# Patient Record
Sex: Male | Born: 1960 | Race: Black or African American | Hispanic: No | Marital: Married | Smoking: Never smoker
Health system: Southern US, Community
[De-identification: ages and names within clinical notes are randomized; demographics above are authoritative.]

## PROBLEM LIST (undated history)

## (undated) DIAGNOSIS — G4733 Obstructive sleep apnea (adult) (pediatric): Secondary | ICD-10-CM

## (undated) DIAGNOSIS — I10 Essential (primary) hypertension: Secondary | ICD-10-CM

## (undated) DIAGNOSIS — N200 Calculus of kidney: Secondary | ICD-10-CM

## (undated) HISTORY — PX: HERNIA REPAIR: SHX51

## (undated) HISTORY — PX: LITHOTRIPSY: SUR834

## (undated) HISTORY — PX: BACK SURGERY: SHX140

## (undated) HISTORY — PX: KNEE SURGERY: SHX244

## (undated) HISTORY — PX: SHOULDER SURGERY: SHX246

---

## 2015-11-01 ENCOUNTER — Encounter (HOSPITAL_BASED_OUTPATIENT_CLINIC_OR_DEPARTMENT_OTHER): Payer: Self-pay | Admitting: *Deleted

## 2015-11-01 ENCOUNTER — Emergency Department (HOSPITAL_BASED_OUTPATIENT_CLINIC_OR_DEPARTMENT_OTHER)
Admission: EM | Admit: 2015-11-01 | Discharge: 2015-11-01 | Disposition: A | Discharge status: Home / Self Care | Payer: BLUE CROSS/BLUE SHIELD | Attending: Emergency Medicine | Admitting: Emergency Medicine

## 2015-11-01 DIAGNOSIS — M542 Cervicalgia: Secondary | ICD-10-CM | POA: Diagnosis not present

## 2015-11-01 DIAGNOSIS — M79602 Pain in left arm: Secondary | ICD-10-CM | POA: Diagnosis not present

## 2015-11-01 DIAGNOSIS — M549 Dorsalgia, unspecified: Secondary | ICD-10-CM | POA: Insufficient documentation

## 2015-11-01 DIAGNOSIS — Z79899 Other long term (current) drug therapy: Secondary | ICD-10-CM | POA: Insufficient documentation

## 2015-11-01 HISTORY — DX: Calculus of kidney: N20.0

## 2015-11-01 MED ORDER — KETOROLAC TROMETHAMINE 30 MG/ML IJ SOLN
30.0000 mg | Freq: Once | INTRAMUSCULAR | Status: AC
  Administered 2015-11-01: 30 mg via INTRAMUSCULAR
  Filled 2015-11-01: qty 1

## 2015-11-01 NOTE — Discharge Instructions (Signed)
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
## 2015-11-01 NOTE — ED Notes (Signed)
Pt given d/c instructions as per chart. Verbalizes understanding. No questions. 

## 2016-02-22 ENCOUNTER — Encounter (HOSPITAL_BASED_OUTPATIENT_CLINIC_OR_DEPARTMENT_OTHER): Payer: Self-pay | Admitting: *Deleted

## 2016-02-22 ENCOUNTER — Emergency Department (HOSPITAL_BASED_OUTPATIENT_CLINIC_OR_DEPARTMENT_OTHER)
Admission: EM | Admit: 2016-02-22 | Discharge: 2016-02-22 | Disposition: A | Discharge status: Home / Self Care | Payer: BLUE CROSS/BLUE SHIELD | Attending: Emergency Medicine | Admitting: Emergency Medicine

## 2016-02-22 DIAGNOSIS — R1032 Left lower quadrant pain: Secondary | ICD-10-CM | POA: Diagnosis present

## 2016-02-22 DIAGNOSIS — N23 Unspecified renal colic: Secondary | ICD-10-CM

## 2016-02-22 DIAGNOSIS — N201 Calculus of ureter: Secondary | ICD-10-CM | POA: Diagnosis not present

## 2016-02-22 HISTORY — DX: Obstructive sleep apnea (adult) (pediatric): G47.33

## 2016-02-22 LAB — URINALYSIS, ROUTINE W REFLEX MICROSCOPIC
Bilirubin Urine: NEGATIVE
Glucose, UA: NEGATIVE mg/dL
Ketones, ur: NEGATIVE mg/dL
Leukocytes, UA: NEGATIVE
Nitrite: NEGATIVE
Protein, ur: NEGATIVE mg/dL
Specific Gravity, Urine: 1.01 (ref 1.005–1.030)
pH: 6.5 (ref 5.0–8.0)

## 2016-02-22 LAB — URINE MICROSCOPIC-ADD ON: Bacteria, UA: NONE SEEN

## 2016-02-22 MED ORDER — IBUPROFEN 800 MG PO TABS
800.0000 mg | ORAL_TABLET | Freq: Once | ORAL | Status: AC
  Administered 2016-02-22: 800 mg via ORAL
  Filled 2016-02-22: qty 1

## 2016-02-22 NOTE — ED Provider Notes (Signed)
[place] DEPT [facility] Provider Note   CSN: [id number] Arrival date & time: 02/22/16  0534     History   Chief Complaint Chief Complaint  Patient presents with  . Abdominal Pain    LLQ, L inguinal    HPI Ronald Hurley is a 55 y.o. male.Complains of left lower quadrant pain nonradiating typical of kidney stone he said in the past began approximately 24 hours ago. Pain waxes and wanes. He's been asymptomatic since he passed a kidney stone here in the emergency department this morning. He treated himself with one Percocet tablet. No nausea or vomiting. No fever. Last bowel movement yesterday, normal. No other associated symptoms.  HPI  Past Medical History:  Diagnosis Date  . Kidney stone   . OSA (obstructive sleep apnea)     There are no active problems to display for this patient.   Past Surgical History:  Procedure Laterality Date  . BACK SURGERY    . HERNIA REPAIR    . KNEE SURGERY    . KNEE SURGERY    . SHOULDER SURGERY    . SHOULDER SURGERY     Umbilical hernia repair    Home Medications    Prior to Admission medications   Medication Sig Start Date End Date Taking? Authorizing Provider  PARoxetine (PAXIL) 10 MG tablet Take 10 mg by mouth daily.    Historical Provider, MD    Family History History reviewed. No pertinent family history.  Social History Social History  Substance Use Topics  . Smoking status: Never Smoker  . Smokeless tobacco: Never Used  . Alcohol use No   Occasional alcohol use no illicit drug use  Allergies   Review of patient's allergies indicates no known allergies.   Review of Systems Review of Systems  Constitutional: Negative.   HENT: Negative.   Respiratory: Negative.   Cardiovascular: Negative.   Gastrointestinal: Positive for abdominal pain.  Musculoskeletal: Negative.   Skin: Negative.   Neurological: Negative.   Psychiatric/Behavioral: Negative.   All other systems reviewed and are negative.    Physical  Exam Updated Vital Signs BP 132/93 (BP Location: Left Arm)   Pulse 60   Temp 97.7 F (36.5 C) (Oral)   Resp 20   Ht 6\' 1"  (1.854 m)   Wt 240 lb (108.9 kg)   SpO2 99%   BMI 31.66 kg/m   Physical Exam  Constitutional: He appears well-developed and well-nourished.  HENT:  Head: Normocephalic and atraumatic.  Eyes: Conjunctivae are normal. Pupils are equal, round, and reactive to light.  Neck: Neck supple. No tracheal deviation present. No thyromegaly present.  Cardiovascular: Normal rate and regular rhythm.   No murmur heard. Pulmonary/Chest: Effort normal and breath sounds normal.  Abdominal: Soft. Bowel sounds are normal. He exhibits no distension. There is no tenderness.  Genitourinary: Penis normal.  Genitourinary Comments: Scrotum normal  Musculoskeletal: Normal range of motion. He exhibits no edema or tenderness.  Neurological: He is alert. Coordination normal.  Skin: Skin is warm and dry. No rash noted.  Psychiatric: He has a normal mood and affect.  Nursing note and vitals reviewed.    ED Treatments / Results  Labs (all labs ordered are listed, but only abnormal results are displayed) Labs Reviewed  URINALYSIS, ROUTINE W REFLEX MICROSCOPIC (NOT AT [facility]) - Abnormal; Notable for the following:       Result Value   Hgb urine dipstick LARGE (*)    All other components within normal limits  URINE MICROSCOPIC-ADD ON -  Abnormal; Notable for the following:    Squamous Epithelial / LPF 0-5 (*)    All other components within normal limits    EKG  EKG Interpretation None       Radiology No results found.  Procedures Procedures (including critical care time) Results for orders placed or performed during the hospital encounter of 02/22/16  Urinalysis, Routine w reflex microscopic (not at [facility]  Result Value Ref Range   Color, Urine YELLOW YELLOW   APPearance CLEAR CLEAR   Specific Gravity, Urine 1.010 1.005 - 1.030   pH 6.5 5.0 - 8.0   Glucose, UA NEGATIVE  NEGATIVE mg/dL   Hgb urine dipstick LARGE (A) NEGATIVE   Bilirubin Urine NEGATIVE NEGATIVE   Ketones, ur NEGATIVE NEGATIVE mg/dL   Protein, ur NEGATIVE NEGATIVE mg/dL   Nitrite NEGATIVE NEGATIVE   Leukocytes, UA NEGATIVE NEGATIVE  Urine microscopic-add on  Result Value Ref Range   Squamous Epithelial / LPF 0-5 (A) NONE SEEN   WBC, UA 0-5 0 - 5 WBC/hpf   RBC / HPF 6-30 0 - 5 RBC/hpf   Bacteria, UA NONE SEEN NONE SEEN   Urine-Other MUCOUS PRESENT    No results found. Medications Ordered in ED Medications  ibuprofen (ADVIL,MOTRIN) tablet 800 mg (800 mg Oral Given 02/22/16 0651)   Results for orders placed or performed during the hospital encounter of 02/22/16  Urinalysis, Routine w reflex microscopic (not at [facility])  Result Value Ref Range   Color, Urine YELLOW YELLOW   APPearance CLEAR CLEAR   Specific Gravity, Urine 1.010 1.005 - 1.030   pH 6.5 5.0 - 8.0   Glucose, UA NEGATIVE NEGATIVE mg/dL   Hgb urine dipstick LARGE (A) NEGATIVE   Bilirubin Urine NEGATIVE NEGATIVE   Ketones, ur NEGATIVE NEGATIVE mg/dL   Protein, ur NEGATIVE NEGATIVE mg/dL   Nitrite NEGATIVE NEGATIVE   Leukocytes, UA NEGATIVE NEGATIVE  Urine microscopic-add on  Result Value Ref Range   Squamous Epithelial / LPF 0-5 (A) NONE SEEN   WBC, UA 0-5 0 - 5 WBC/hpf   RBC / HPF 6-30 0 - 5 RBC/hpf   Bacteria, UA NONE SEEN NONE SEEN   Urine-Other MUCOUS PRESENT    No results found.  Initial Impression / Assessment and Plan / ED Course  I have reviewed the triage vital signs and the nursing notes.  Pertinent labs & imaging results that were available during my care of the patient were reviewed by me and considered in my medical decision making (see chart for details).  Clinical Course    Patient asymptomatic here. He's advised to take Advil 4 tablets every 8 hours as needed for pain. He has 1 remaining Percocet tablet. He is advised follow-up with his urologist whom he seen in the past in [place]. He can  take   the kidney stone that he passed today in the emergency department to the urologist office Final Clinical Impressions(s) / ED Diagnoses  Diagnosis ureteral colic Final diagnoses:  Ureteral colic    New Prescriptions New Prescriptions   No medications on file     Orlie Dakin, MD 02/22/16 [phone]

## 2016-02-22 NOTE — ED Notes (Signed)
Dr. Winfred Leeds at [facility]

## 2016-02-22 NOTE — ED Triage Notes (Signed)
C/o LLQ/ L inguinal area pain, onset 0045, "felt like previous kidney stone 3 yrs ago", mentions some urinary hesitancy, (denies: nvd, fever, bleeding or other sx), took a percocet 5/325 at 0100, rates pain 5/10 at this time. Also states, "I think I passed it with urine sample", stone particle saved in specimen cup at [facility]).

## 2016-02-22 NOTE — Discharge Instructions (Signed)
You can take 4 Advil tablets every 8 hours as needed for pain. Call your urologist today to schedule the next available appointment. Take the kidney stone that he passed with you to the urologist's office

## 2016-05-12 ENCOUNTER — Emergency Department (HOSPITAL_BASED_OUTPATIENT_CLINIC_OR_DEPARTMENT_OTHER): Payer: BLUE CROSS/BLUE SHIELD

## 2016-05-12 ENCOUNTER — Emergency Department (HOSPITAL_BASED_OUTPATIENT_CLINIC_OR_DEPARTMENT_OTHER)
Admission: EM | Admit: 2016-05-12 | Discharge: 2016-05-12 | Disposition: A | Discharge status: Home / Self Care | Payer: BLUE CROSS/BLUE SHIELD | Attending: Emergency Medicine | Admitting: Emergency Medicine

## 2016-05-12 ENCOUNTER — Encounter (HOSPITAL_BASED_OUTPATIENT_CLINIC_OR_DEPARTMENT_OTHER): Payer: Self-pay

## 2016-05-12 DIAGNOSIS — J209 Acute bronchitis, unspecified: Secondary | ICD-10-CM | POA: Diagnosis not present

## 2016-05-12 DIAGNOSIS — Z79899 Other long term (current) drug therapy: Secondary | ICD-10-CM | POA: Insufficient documentation

## 2016-05-12 DIAGNOSIS — R079 Chest pain, unspecified: Secondary | ICD-10-CM

## 2016-05-12 DIAGNOSIS — R072 Precordial pain: Secondary | ICD-10-CM | POA: Diagnosis present

## 2016-05-12 LAB — CBC WITH DIFFERENTIAL/PLATELET
Basophils Absolute: 0 10*3/uL (ref 0.0–0.1)
Basophils Relative: 1 %
Eosinophils Absolute: 0.5 10*3/uL (ref 0.0–0.7)
Eosinophils Relative: 8 %
HCT: 45.1 % (ref 39.0–52.0)
Hemoglobin: 15.4 g/dL (ref 13.0–17.0)
Lymphocytes Relative: 23 %
Lymphs Abs: 1.4 10*3/uL (ref 0.7–4.0)
MCH: 29.8 pg (ref 26.0–34.0)
MCHC: 34.1 g/dL (ref 30.0–36.0)
MCV: 87.4 fL (ref 78.0–100.0)
Monocytes Absolute: 0.7 10*3/uL (ref 0.1–1.0)
Monocytes Relative: 11 %
Neutro Abs: 3.6 10*3/uL (ref 1.7–7.7)
Neutrophils Relative %: 57 %
Platelets: 167 10*3/uL (ref 150–400)
RBC: 5.16 MIL/uL (ref 4.22–5.81)
RDW: 13 % (ref 11.5–15.5)
WBC: 6.3 10*3/uL (ref 4.0–10.5)

## 2016-05-12 LAB — BASIC METABOLIC PANEL
Anion gap: 5 (ref 5–15)
BUN: 17 mg/dL (ref 6–20)
CO2: 24 mmol/L (ref 22–32)
Calcium: 8.9 mg/dL (ref 8.9–10.3)
Chloride: 110 mmol/L (ref 101–111)
Creatinine, Ser: 1.27 mg/dL — ABNORMAL HIGH (ref 0.61–1.24)
GFR calc Af Amer: >60 mL/min (ref >60)
GFR calc non Af Amer: >60 mL/min (ref >60)
Glucose, Bld: 87 mg/dL (ref 65–99)
Potassium: 4.4 mmol/L (ref 3.5–5.1)
Sodium: 139 mmol/L (ref 135–145)

## 2016-05-12 LAB — TROPONIN I: Troponin I: <0.03 ng/mL (ref <0.03)

## 2016-05-12 MED ORDER — AZITHROMYCIN 250 MG PO TABS: ORAL_TABLET | ORAL | 0 refills | Status: DC

## 2016-05-12 NOTE — Discharge Instructions (Signed)
Zithromax as prescribed.  Ibuprofen 600 mg every 6 hours as needed for pain.  Return to the emergency department if your symptoms significantly worsen or change.
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
## 2016-05-12 NOTE — ED Notes (Signed)
Pt returned from xray

## 2016-05-12 NOTE — ED Triage Notes (Signed)
Mid sternal chest pain that started last night made worse with movement and coughing.  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  Pt is not in any acute distress, speaking in full sentences and able to ambulate without difficulty or SOB.

## 2016-05-12 NOTE — ED Notes (Signed)
ED Provider at bedside. 

## 2016-08-01 ENCOUNTER — Encounter (HOSPITAL_BASED_OUTPATIENT_CLINIC_OR_DEPARTMENT_OTHER): Payer: Self-pay | Admitting: Emergency Medicine

## 2016-08-01 ENCOUNTER — Emergency Department (HOSPITAL_BASED_OUTPATIENT_CLINIC_OR_DEPARTMENT_OTHER)
Admission: EM | Admit: 2016-08-01 | Discharge: 2016-08-01 | Disposition: A | Discharge status: Home / Self Care | Payer: BLUE CROSS/BLUE SHIELD | Attending: Emergency Medicine | Admitting: Emergency Medicine

## 2016-08-01 DIAGNOSIS — R197 Diarrhea, unspecified: Secondary | ICD-10-CM | POA: Diagnosis not present

## 2016-08-01 DIAGNOSIS — Z79899 Other long term (current) drug therapy: Secondary | ICD-10-CM | POA: Diagnosis not present

## 2016-08-01 DIAGNOSIS — R51 Headache: Secondary | ICD-10-CM | POA: Diagnosis not present

## 2016-08-01 DIAGNOSIS — J029 Acute pharyngitis, unspecified: Secondary | ICD-10-CM | POA: Insufficient documentation

## 2016-08-01 DIAGNOSIS — R079 Chest pain, unspecified: Secondary | ICD-10-CM | POA: Insufficient documentation

## 2016-08-01 DIAGNOSIS — R05 Cough: Secondary | ICD-10-CM | POA: Insufficient documentation

## 2016-08-01 DIAGNOSIS — R509 Fever, unspecified: Secondary | ICD-10-CM | POA: Diagnosis not present

## 2016-08-01 DIAGNOSIS — J111 Influenza due to unidentified influenza virus with other respiratory manifestations: Secondary | ICD-10-CM

## 2016-08-01 DIAGNOSIS — R69 Illness, unspecified: Secondary | ICD-10-CM

## 2016-08-01 MED ORDER — IBUPROFEN 800 MG PO TABS
800.0000 mg | ORAL_TABLET | Freq: Once | ORAL | Status: AC
  Administered 2016-08-01: 800 mg via ORAL
  Filled 2016-08-01: qty 1

## 2016-08-01 MED ORDER — GUAIFENESIN-CODEINE 100-10 MG/5ML PO SOLN: 10.0000 mL | Freq: Four times a day (QID) | ORAL | 0 refills | Status: DC | PRN

## 2016-08-01 MED ORDER — LOPERAMIDE HCL 2 MG PO CAPS
2.0000 mg | ORAL_CAPSULE | Freq: Once | ORAL | Status: AC
  Administered 2016-08-01: 2 mg via ORAL
  Filled 2016-08-01: qty 1

## 2016-08-01 NOTE — ED Triage Notes (Signed)
C/o productive cough, congestion, sneezing, subjective fever, diarrhea, headache. Denies nausea, vomiting, sore throat or other symptoms. Reports chest pain with coughing, none without coughing.

## 2016-08-01 NOTE — Discharge Instructions (Signed)
You may alternate between Tylenol 1000 mg every 6 hours as needed for fever and pain and ibuprofen 800 mg every 8 hours as needed for fever and pain. Both of these medications are found over-the-counter. Please rest and drink plenty of fluids. The fluid is a virus. It is not a bacterial illness therefore you do not need antibiotics as they will not be helpful.

## 2016-08-01 NOTE — ED Provider Notes (Signed)
TIME SEEN: 6:10 AM  CHIEF COMPLAINT: Flulike symptoms  HPI: Pt is a 56 y.o. male with no significant past medical history who presents emergency bar with 3-4 days of cough with chest pain with coughing, sore throat, body aches, diffuse throbbing headache, subjective fevers and chills, diarrhea. No nausea or vomiting. No shortness of breath. Here with a friend who has similar symptoms. No recent travel. Last took Tylenol and Mucinex at 11 PM last night.  ROS: See HPI Constitutional: Subjective fever  Eyes: no drainage  ENT: no runny nose   Cardiovascular:   chest pain only with coughing Resp: no SOB  GI: no vomiting; + diarrhea GU: no dysuria Integumentary: no rash  Allergy: no hives  Musculoskeletal: no leg swelling  Neurological: no slurred speech ROS otherwise negative  PAST MEDICAL HISTORY/PAST SURGICAL HISTORY:  Past Medical History:  Diagnosis Date  . Kidney stone   . OSA (obstructive sleep apnea)     MEDICATIONS:  Prior to Admission medications   Medication Sig Start Date End Date Taking? Authorizing Provider  azithromycin (ZITHROMAX Z-PAK) 250 MG tablet 2 po day one, then 1 daily x 4 days 05/12/16  Yes Veryl Speak, MD  diclofenac (VOLTAREN) 75 MG EC tablet Take 75 mg by mouth 2 (two) times daily.   Yes Historical Provider, MD  hydrOXYzine (VISTARIL) 25 MG capsule Take 25 mg by mouth 3 (three) times daily as needed.   Yes Historical Provider, MD  PARoxetine (PAXIL) 20 MG tablet Take 20 mg by mouth daily.    Historical Provider, MD    ALLERGIES:  No Known Allergies  SOCIAL HISTORY:  Social History  Substance Use Topics  . Smoking status: Never Smoker  . Smokeless tobacco: Never Used  . Alcohol use No    FAMILY HISTORY: No family history on file.  EXAM: BP 135/88 (BP Location: Right Arm)   Pulse 76   Temp 98.2 F (36.8 C) (Oral)   Resp 19   Ht 6\' 1"  (1.854 m)   Wt 235 lb (106.6 kg)   SpO2 97%   BMI 31.00 kg/m  CONSTITUTIONAL: Alert and oriented and  responds appropriately to questions. Well-appearing; well-nourished HEAD: Normocephalic EYES: Conjunctivae clear, PERRL, EOMI ENT: normal nose; no rhinorrhea; moist mucous membranes; No pharyngeal erythema or petechiae, no tonsillar hypertrophy or exudate, no uvular deviation, no unilateral swelling, no trismus or drooling, no muffled voice, normal phonation, no stridor, no dental caries present, no drainable dental abscess noted, no Ludwig's angina, tongue sits flat in the bottom of the mouth, no angioedema, no facial erythema or warmth, no facial swelling; no pain with movement of the neck NECK: Supple, no meningismus, no nuchal rigidity, no LAD  CARD: RRR; S1 and S2 appreciated; no murmurs, no clicks, no rubs, no gallops RESP: Normal chest excursion without splinting or tachypnea; breath sounds clear and equal bilaterally; no wheezes, no rhonchi, no rales, no hypoxia or respiratory distress, speaking full sentences ABD/GI: Normal bowel sounds; non-distended; soft, non-tender, no rebound, no guarding, no peritoneal signs, no hepatosplenomegaly BACK:  The back appears normal and is non-tender to palpation, there is no CVA tenderness EXT: Normal ROM in all joints; non-tender to palpation; no edema; normal capillary refill; no cyanosis, no calf tenderness or swelling    SKIN: Normal color for age and race; warm; no rash NEURO: Moves all extremities equally, sensation to light touch intact diffusely, cranial nerves II through XII intact, normal speech PSYCH: The patient's mood and manner are appropriate. Grooming and personal  hygiene are appropriate.  MEDICAL DECISION MAKING:   Patient here with flulike symptoms. Very well-appearing on exam. Doubt meningitis, pneumonia. Lungs are completely clear to auscultation with dry cough in the room. No respiratory distress, hypoxia. I do not feel he needs a chest x-ray at this time. I suspicion for bacterial illness is very low. He is outside treatment window for  Tamiflu. Discussed my recommendations alternating Tylenol and Motrin, increased rest and fluid intake. We'll discharge with guaifenesin with codeine for symptomatic relief. I do not feel he needs further emergent workup at this time. Recommend PCP follow-up if symptoms worsen, continue past 1 week. Have provided him with a work note.   At this time, I do not feel there is any life-threatening condition present. I have reviewed and discussed all results (EKG, imaging, lab, urine as appropriate) and exam findings with patient/family. I have reviewed nursing notes and appropriate previous records.  I feel the patient is safe to be discharged home without further emergent workup and can continue workup as an outpatient as needed. Discussed usual and customary return precautions. Patient/family verbalize understanding and are comfortable with this plan.  Outpatient follow-up has been provided. All questions have been answered.      [place], DO 08/01/16 [phone]

## 2018-11-04 ENCOUNTER — Emergency Department (HOSPITAL_BASED_OUTPATIENT_CLINIC_OR_DEPARTMENT_OTHER)
Admission: EM | Admit: 2018-11-04 | Discharge: 2018-11-04 | Disposition: A | Discharge status: Home / Self Care | Payer: BLUE CROSS/BLUE SHIELD | Attending: Emergency Medicine | Admitting: Emergency Medicine

## 2018-11-04 ENCOUNTER — Other Ambulatory Visit: Payer: Self-pay

## 2018-11-04 ENCOUNTER — Encounter (HOSPITAL_BASED_OUTPATIENT_CLINIC_OR_DEPARTMENT_OTHER): Payer: Self-pay | Admitting: *Deleted

## 2018-11-04 ENCOUNTER — Emergency Department (HOSPITAL_BASED_OUTPATIENT_CLINIC_OR_DEPARTMENT_OTHER): Payer: BLUE CROSS/BLUE SHIELD

## 2018-11-04 DIAGNOSIS — N201 Calculus of ureter: Secondary | ICD-10-CM | POA: Insufficient documentation

## 2018-11-04 DIAGNOSIS — R1032 Left lower quadrant pain: Secondary | ICD-10-CM | POA: Diagnosis present

## 2018-11-04 DIAGNOSIS — I10 Essential (primary) hypertension: Secondary | ICD-10-CM | POA: Insufficient documentation

## 2018-11-04 HISTORY — DX: Essential (primary) hypertension: I10

## 2018-11-04 LAB — URINALYSIS, ROUTINE W REFLEX MICROSCOPIC
Bilirubin Urine: NEGATIVE
Glucose, UA: NEGATIVE mg/dL
Ketones, ur: NEGATIVE mg/dL
Nitrite: NEGATIVE
Protein, ur: NEGATIVE mg/dL
Specific Gravity, Urine: 1.015 (ref 1.005–1.030)
pH: 7 (ref 5.0–8.0)

## 2018-11-04 LAB — URINALYSIS, MICROSCOPIC (REFLEX): RBC / HPF: >50 RBC/hpf (ref 0–5)

## 2018-11-04 LAB — CBC WITH DIFFERENTIAL/PLATELET
Abs Immature Granulocytes: 0.03 10*3/uL (ref 0.00–0.07)
Basophils Absolute: 0 10*3/uL (ref 0.0–0.1)
Basophils Relative: 0 %
Eosinophils Absolute: 0.1 10*3/uL (ref 0.0–0.5)
Eosinophils Relative: 2 %
HCT: 46.6 % (ref 39.0–52.0)
Hemoglobin: 15.5 g/dL (ref 13.0–17.0)
Immature Granulocytes: 0 %
Lymphocytes Relative: 12 %
Lymphs Abs: 1 10*3/uL (ref 0.7–4.0)
MCH: 29.5 pg (ref 26.0–34.0)
MCHC: 33.3 g/dL (ref 30.0–36.0)
MCV: 88.8 fL (ref 80.0–100.0)
Monocytes Absolute: 0.7 10*3/uL (ref 0.1–1.0)
Monocytes Relative: 9 %
Neutro Abs: 6.2 10*3/uL (ref 1.7–7.7)
Neutrophils Relative %: 77 %
Platelets: 185 10*3/uL (ref 150–400)
RBC: 5.25 MIL/uL (ref 4.22–5.81)
RDW: 12.5 % (ref 11.5–15.5)
WBC: 8.1 10*3/uL (ref 4.0–10.5)
nRBC: 0 % (ref 0.0–0.2)

## 2018-11-04 LAB — BASIC METABOLIC PANEL
Anion gap: 6 (ref 5–15)
BUN: 14 mg/dL (ref 6–20)
CO2: 22 mmol/L (ref 22–32)
Calcium: 8.5 mg/dL — ABNORMAL LOW (ref 8.9–10.3)
Chloride: 110 mmol/L (ref 98–111)
Creatinine, Ser: 1.48 mg/dL — ABNORMAL HIGH (ref 0.61–1.24)
GFR calc Af Amer: 60 mL/min — ABNORMAL LOW (ref >60)
GFR calc non Af Amer: 51 mL/min — ABNORMAL LOW (ref >60)
Glucose, Bld: 128 mg/dL — ABNORMAL HIGH (ref 70–99)
Potassium: 3.9 mmol/L (ref 3.5–5.1)
Sodium: 138 mmol/L (ref 135–145)

## 2018-11-04 MED ORDER — ONDANSETRON HCL 4 MG/2ML IJ SOLN
4.0000 mg | Freq: Once | INTRAMUSCULAR | Status: AC
  Administered 2018-11-04: 05:00:00 4 mg via INTRAVENOUS
  Filled 2018-11-04: qty 2

## 2018-11-04 MED ORDER — HYDROMORPHONE HCL 1 MG/ML IJ SOLN
1.0000 mg | Freq: Once | INTRAMUSCULAR | Status: AC
Start: 2018-11-04 — End: 2018-11-04
  Administered 2018-11-04: 07:00:00 1 mg via INTRAVENOUS
  Filled 2018-11-04: qty 1

## 2018-11-04 MED ORDER — KETOROLAC TROMETHAMINE 30 MG/ML IJ SOLN
15.0000 mg | Freq: Once | INTRAMUSCULAR | Status: AC
  Administered 2018-11-04: 07:00:00 15 mg via INTRAVENOUS
  Filled 2018-11-04: qty 1

## 2018-11-04 MED ORDER — LACTATED RINGERS IV BOLUS
1000.0000 mL | Freq: Once | INTRAVENOUS | Status: AC
  Administered 2018-11-04: 05:00:00 1000 mL via INTRAVENOUS

## 2018-11-04 MED ORDER — MORPHINE SULFATE (PF) 4 MG/ML IV SOLN
6.0000 mg | Freq: Once | INTRAVENOUS | Status: AC
  Administered 2018-11-04: 05:00:00 6 mg via INTRAVENOUS
  Filled 2018-11-04: qty 2

## 2018-11-04 MED ORDER — LACTATED RINGERS IV BOLUS
1000.0000 mL | Freq: Once | INTRAVENOUS | Status: AC
  Administered 2018-11-04: 07:00:00 1000 mL via INTRAVENOUS

## 2018-11-04 NOTE — ED Notes (Signed)
Pt up attempting to provide urine sample. Reports trouble starting his stream. Will continue to monitor

## 2018-11-04 NOTE — Discharge Instructions (Addendum)
You were seen in the emergency department for increased left flank pain after having had lithotripsy Friday.  Your CAT scan showed that you still have multiple stones to pass.  Please drink plenty of fluids and try to stay ahead of your pain with the pain medicines prescribed by your urologist.  Call your urologist if you are having worsening pain or any fever.  Return if any concerns.

## 2018-11-04 NOTE — ED Notes (Signed)
Patient transported to CT 

## 2018-11-04 NOTE — ED Provider Notes (Signed)
Emergency Department Provider Note   I have reviewed the triage vital signs and the nursing notes.   HISTORY  Chief Complaint left flank pain/groin pain   HPI Ronald Hurley is a 58 y.o. male with a history of nephrolithiasis status post lithotripsy a few days ago the presents emergency department today with progressively worsening left flank pain and suprapubic abdominal pain ever since his lithotripsy.  He states it feels like kidney stones but worse than what he is had in the past.  Patient states he did pass a little bit of a kidney stone with some urine prior to arrival here this morning.  Did have some blood in it as well.  Did have urinary retention for quite a few hours up until this.  No fevers but does have some nausea and one episode of vomiting.  No other GI symptoms, rashes or trauma.   No other associated or modifying symptoms.    Past Medical History:  Diagnosis Date  . Hypertension   . Kidney stone   . OSA (obstructive sleep apnea)     There are no active problems to display for this patient.   Past Surgical History:  Procedure Laterality Date  . BACK SURGERY    . HERNIA REPAIR    . KNEE SURGERY    . KNEE SURGERY    . LITHOTRIPSY    . SHOULDER SURGERY    . SHOULDER SURGERY      Current Outpatient Rx  . Order #: [id number] Class: Historical Med  . Order #: [id number] Class: Historical Med  . Order #: [id number] Class: Print  . Order #: [id number] Class: Historical Med  . Order #: [id number] Class: Print  . Order #: [id number] Class: Historical Med  . Order #: [id number] Class: Historical Med    Allergies Patient has no known allergies.  No family history on file.  Social History Social History   Tobacco Use  . Smoking status: Never Smoker  . Smokeless tobacco: Never Used  Substance Use Topics  . Alcohol use: No  . Drug use: No    Review of Systems  All other systems negative except as documented in the HPI. All pertinent positives and negatives  as reviewed in the HPI. ____________________________________________   PHYSICAL EXAM:  VITAL SIGNS: ED Triage Vitals  Enc Vitals Group     BP 11/04/18 0509 (!) 147/87     Pulse Rate 11/04/18 0509 84     Resp 11/04/18 0509 16     Temp 11/04/18 0509 98.4 F (36.9 C)     Temp Source 11/04/18 0509 Oral     SpO2 11/04/18 0509 100 %     Weight 11/04/18 0505 240 lb (108.9 kg)     Height 11/04/18 0505 6\' 1"  (1.854 m)    Constitutional: Alert and oriented. Well appearing and in no acute distress. Eyes: Conjunctivae are normal. PERRL. EOMI. Head: Atraumatic. Nose: No congestion/rhinnorhea. Mouth/Throat: Mucous membranes are moist.  Oropharynx non-erythematous. Neck: No stridor.  No meningeal signs.   Cardiovascular: Normal rate, regular rhythm. Good peripheral circulation. Grossly normal heart sounds.   Respiratory: Normal respiratory effort.  No retractions. Lungs CTAB. Gastrointestinal: Soft and nontender. No distention.  Musculoskeletal: No lower extremity tenderness nor edema. No gross deformities of extremities. Neurologic:  Normal speech and language. No gross focal neurologic deficits are appreciated.  Skin:  Skin is warm, dry and intact. No rash noted.   ____________________________________________   LABS (all labs ordered are listed, but only abnormal results are displayed)  Labs Reviewed  URINALYSIS, ROUTINE W REFLEX MICROSCOPIC - Abnormal; Notable for the following components:      Result Value   APPearance CLOUDY (*)    Hgb urine dipstick LARGE (*)    Leukocytes,Ua TRACE (*)    All other components within normal limits  BASIC METABOLIC PANEL - Abnormal; Notable for the following components:   Glucose, Bld 128 (*)    Creatinine, Ser 1.48 (*)    Calcium 8.5 (*)    GFR calc non Af Amer 51 (*)    GFR calc Af Amer 60 (*)    All other components within normal limits  URINALYSIS, MICROSCOPIC (REFLEX) - Abnormal; Notable for the following components:   Bacteria, UA  MANY (*)    All other components within normal limits  CBC WITH DIFFERENTIAL/PLATELET   ____________________________________________  RADIOLOGY  No results found.  ____________________________________________   PROCEDURES  Procedure(s) performed:   Procedures   ____________________________________________   INITIAL IMPRESSION / ASSESSMENT AND PLAN / ED COURSE  Suspect lithotripsy broke up the stone and it is now passing it account for his pain.  We will give some fluids, pain medication and check his labs.  On reevaluation patient states his knee has improved slightly but is still very much present.  Has not been on give a urine sample even after liter of fluids.  His bladder scan showed nothing in his bladder initially.  We will give him more pain medication and add on a CT scan to make sure he does have any severe hydronephrosis with his slightly worsening GFR.  Care transferred pending CT scan/UA and likely dc as symptoms are controlled.   Pertinent labs & imaging results that were available during my care of the patient were reviewed by me and considered in my medical decision making (see chart for details).    ____________________________________________  FINAL CLINICAL IMPRESSION(S) / ED DIAGNOSES  Final diagnoses:  None     MEDICATIONS GIVEN DURING THIS VISIT:  Medications  HYDROmorphone (DILAUDID) injection 1 mg (has no administration in time range)  ketorolac (TORADOL) 30 MG/ML injection 15 mg (has no administration in time range)  morphine 4 MG/ML injection 6 mg (6 mg Intravenous Given 11/04/18 0522)  ondansetron (ZOFRAN) injection 4 mg (4 mg Intravenous Given 11/04/18 0522)  lactated ringers bolus 1,000 mL (0 mLs Intravenous Stopped 11/04/18 0625)  lactated ringers bolus 1,000 mL (1,000 mLs Intravenous New Bag/Given 11/04/18 0641)     NEW OUTPATIENT MEDICATIONS STARTED DURING THIS VISIT:  New Prescriptions   No medications on file    Note:  This note was  prepared with assistance of Dragon voice recognition software. Occasional wrong-word or sound-a-like substitutions may have occurred due to the inherent limitations of voice recognition software.   Murrell Dome, Corene Cornea, MD 11/05/18 [phone]

## 2018-11-04 NOTE — ED Triage Notes (Addendum)
Pt states he had a lithotripsy on Friday. C/o left groin and flank pain that started last night,but worse this morning. States he urinated pta, but states it took hours for him to be able to urinate.  Denies any fevers. Hx of kidney stones. Took "pain pill" around 6pm. C/o nausea and vomiting times 2.

## 2018-11-04 NOTE — ED Provider Notes (Signed)
Signout from Dr. Dayna Barker.  57 year old male with recent lithotripsy for kidney stones here with acute left groin and flank pain since last evening. Physical Exam  BP (!) 143/94 (BP Location: Right Arm)   Pulse 90   Temp 98.4 F (36.9 C) (Oral)   Resp 18   Ht 6\' 1"  (1.854 m)   Wt 108.9 kg   SpO2 94%   BMI 31.66 kg/m   Physical Exam  ED Course/Procedures   Clinical Course as of Nov 04 842  Sun Nov 04, 2018  0724 Patient CT shows a couple of 5 mm stones proximal and some other smaller stones distal.  I reevaluated him and his pain appears controlled.  I updated him on the results of his CT.  We talked about staying ahead of the pain as opposed to trying to play catch up with it.  Still waiting on a urinalysis.   [MB]  [id number] Patient's urinalysis shows red cells but no significant signs of infection.  His pain seems adequately controlled so we will discharge and have him follow-up with his urologist.   [MB]    Clinical Course User Index [MB] Hayden Rasmussen, MD    Procedures  MDM  Plan is to follow-up on urinalysis and CT KUB.  If no acute findings and pain is controlled can be discharged.  If not may need to reconnect with patient's urologist out of [place]rie Sport, MD 11/04/18 [phone]

## 2018-11-04 NOTE — ED Notes (Signed)
Pt aware of need for urine sample. Brought his urine in a container from home. Provided urinal.

## 2018-11-06 ENCOUNTER — Encounter (HOSPITAL_BASED_OUTPATIENT_CLINIC_OR_DEPARTMENT_OTHER): Payer: Self-pay | Admitting: *Deleted

## 2018-11-06 ENCOUNTER — Other Ambulatory Visit: Payer: Self-pay

## 2018-11-06 ENCOUNTER — Emergency Department (HOSPITAL_BASED_OUTPATIENT_CLINIC_OR_DEPARTMENT_OTHER): Payer: BLUE CROSS/BLUE SHIELD

## 2018-11-06 ENCOUNTER — Emergency Department (HOSPITAL_BASED_OUTPATIENT_CLINIC_OR_DEPARTMENT_OTHER)
Admission: EM | Admit: 2018-11-06 | Discharge: 2018-11-06 | Disposition: A | Discharge status: Other Acute Inpatient Hospital | Payer: BLUE CROSS/BLUE SHIELD | Attending: Emergency Medicine | Admitting: Emergency Medicine

## 2018-11-06 DIAGNOSIS — I10 Essential (primary) hypertension: Secondary | ICD-10-CM | POA: Insufficient documentation

## 2018-11-06 DIAGNOSIS — D1803 Hemangioma of intra-abdominal structures: Secondary | ICD-10-CM | POA: Insufficient documentation

## 2018-11-06 DIAGNOSIS — Z79899 Other long term (current) drug therapy: Secondary | ICD-10-CM | POA: Diagnosis not present

## 2018-11-06 DIAGNOSIS — N201 Calculus of ureter: Secondary | ICD-10-CM | POA: Insufficient documentation

## 2018-11-06 DIAGNOSIS — R109 Unspecified abdominal pain: Secondary | ICD-10-CM | POA: Diagnosis present

## 2018-11-06 LAB — CBC WITH DIFFERENTIAL/PLATELET
Abs Immature Granulocytes: 0.03 10*3/uL (ref 0.00–0.07)
Basophils Absolute: 0 10*3/uL (ref 0.0–0.1)
Basophils Relative: 0 %
Eosinophils Absolute: 0 10*3/uL (ref 0.0–0.5)
Eosinophils Relative: 0 %
HCT: 43.6 % (ref 39.0–52.0)
Hemoglobin: 14.4 g/dL (ref 13.0–17.0)
Immature Granulocytes: 0 %
Lymphocytes Relative: 9 %
Lymphs Abs: 0.8 10*3/uL (ref 0.7–4.0)
MCH: 29.3 pg (ref 26.0–34.0)
MCHC: 33 g/dL (ref 30.0–36.0)
MCV: 88.8 fL (ref 80.0–100.0)
Monocytes Absolute: 0.8 10*3/uL (ref 0.1–1.0)
Monocytes Relative: 9 %
Neutro Abs: 7.3 10*3/uL (ref 1.7–7.7)
Neutrophils Relative %: 82 %
Platelets: 188 10*3/uL (ref 150–400)
RBC: 4.91 MIL/uL (ref 4.22–5.81)
RDW: 12.4 % (ref 11.5–15.5)
WBC: 9 10*3/uL (ref 4.0–10.5)
nRBC: 0 % (ref 0.0–0.2)

## 2018-11-06 LAB — URINALYSIS, ROUTINE W REFLEX MICROSCOPIC
Bilirubin Urine: NEGATIVE
Glucose, UA: NEGATIVE mg/dL
Ketones, ur: 15 mg/dL — AB
Leukocytes,Ua: NEGATIVE
Nitrite: NEGATIVE
Protein, ur: NEGATIVE mg/dL
Specific Gravity, Urine: 1.02 (ref 1.005–1.030)
pH: 6 (ref 5.0–8.0)

## 2018-11-06 LAB — COMPREHENSIVE METABOLIC PANEL
ALT: 21 U/L (ref 0–44)
AST: 17 U/L (ref 15–41)
Albumin: 3.9 g/dL (ref 3.5–5.0)
Alkaline Phosphatase: 52 U/L (ref 38–126)
Anion gap: 7 (ref 5–15)
BUN: 19 mg/dL (ref 6–20)
CO2: 23 mmol/L (ref 22–32)
Calcium: 8.6 mg/dL — ABNORMAL LOW (ref 8.9–10.3)
Chloride: 105 mmol/L (ref 98–111)
Creatinine, Ser: 1.77 mg/dL — ABNORMAL HIGH (ref 0.61–1.24)
GFR calc Af Amer: 48 mL/min — ABNORMAL LOW (ref >60)
GFR calc non Af Amer: 41 mL/min — ABNORMAL LOW (ref >60)
Glucose, Bld: 102 mg/dL — ABNORMAL HIGH (ref 70–99)
Potassium: 3.5 mmol/L (ref 3.5–5.1)
Sodium: 135 mmol/L (ref 135–145)
Total Bilirubin: 0.8 mg/dL (ref 0.3–1.2)
Total Protein: 7 g/dL (ref 6.5–8.1)

## 2018-11-06 LAB — LIPASE, BLOOD: Lipase: 23 U/L (ref 11–51)

## 2018-11-06 LAB — URINALYSIS, MICROSCOPIC (REFLEX): WBC, UA: NONE SEEN WBC/hpf (ref 0–5)

## 2018-11-06 MED ORDER — SODIUM CHLORIDE 0.9 % IV BOLUS
1000.0000 mL | Freq: Once | INTRAVENOUS | Status: AC
  Administered 2018-11-06: 1000 mL via INTRAVENOUS

## 2018-11-06 MED ORDER — HYDROMORPHONE HCL 1 MG/ML IJ SOLN
0.5000 mg | Freq: Once | INTRAMUSCULAR | Status: AC
  Administered 2018-11-06: 0.5 mg via INTRAVENOUS
  Filled 2018-11-06: qty 1

## 2018-11-06 MED ORDER — IOHEXOL 300 MG/ML  SOLN
100.0000 mL | Freq: Once | INTRAMUSCULAR | Status: AC | PRN
  Administered 2018-11-06: 100 mL via INTRAVENOUS

## 2018-11-06 MED ORDER — ONDANSETRON HCL 4 MG/2ML IJ SOLN
4.0000 mg | Freq: Once | INTRAMUSCULAR | Status: AC
  Administered 2018-11-06: 4 mg via INTRAVENOUS
  Filled 2018-11-06: qty 2

## 2018-11-06 NOTE — ED Triage Notes (Signed)
Abdominal pain and bloating.

## 2018-11-06 NOTE — ED Notes (Signed)
[facility] notified [facility] Flattery) - patient ready for transport to [facility] ED (Accepting Physician - Zenaida Deed

## 2018-11-06 NOTE — ED Notes (Signed)
Pt. Unable to urinate at this time and will try again later.

## 2018-11-06 NOTE — ED Provider Notes (Signed)
[facility] EMERGENCY DEPARTMENT Provider Note   CSN: [id number] Arrival date & time: 11/06/18  1827    History   Chief Complaint Chief Complaint  Patient presents with  . Abdominal Pain    HPI Ronald Hurley is a 58 y.o. male.  Who presents the emergency department chief complaint of abdominal pain.  Patient has a past medical history of nephrolithiasis and recently underwent lithotripsy at [facility].  He was seen here with Saturday for severe pain nausea and vomiting.  He had a follow-up appointment via telemedicine with urology today complaining of severe pain in his flank.  He presents today with severe abdominal pain.  Patient states that it hurts worse when he coughs or laughs.  At first he thought it was his stomach muscles from of the retching and vomiting he was doing, however he feels like it is inside his belly.  He also feels that there is some distention in his abdomen.  He denies flank pain and his urine has been without significant change.  He denies fevers or chills.  He has been taking Norco without improvement in his pain.  He denies constipation, diarrhea.     HPI  Past Medical History:  Diagnosis Date  . Hypertension   . Kidney stone   . OSA (obstructive sleep apnea)     There are no active problems to display for this patient.   Past Surgical History:  Procedure Laterality Date  . BACK SURGERY    . HERNIA REPAIR    . KNEE SURGERY    . KNEE SURGERY    . LITHOTRIPSY    . SHOULDER SURGERY    . SHOULDER SURGERY          Home Medications    Prior to Admission medications   Medication Sig Start Date End Date Taking? Authorizing Provider  azithromycin (ZITHROMAX Z-PAK) 250 MG tablet 2 po day one, then 1 daily x 4 days 05/12/16   Veryl Speak, MD  diclofenac (VOLTAREN) 75 MG EC tablet Take 75 mg by mouth 2 (two) times daily.    [provider]  guaiFENesin-codeine 100-10 MG/5ML syrup Take 10 mLs by mouth every 6 (six) hours as  needed for cough. 08/01/16   Ward, Delice Bison, DO  HYDROcodone-acetaminophen (NORCO/VICODIN) 5-325 MG tablet Take 1 tablet by mouth every 6 (six) hours as needed for moderate pain.    [provider]  hydrOXYzine (VISTARIL) 25 MG capsule Take 25 mg by mouth 3 (three) times daily as needed.    [provider]  PARoxetine (PAXIL) 20 MG tablet Take 20 mg by mouth daily.    [provider]  tamsulosin (FLOMAX) 0.4 MG CAPS capsule Take 0.4 mg by mouth.    [provider]    Family History No family history on file.  Social History Social History   Tobacco Use  . Smoking status: Never Smoker  . Smokeless tobacco: Never Used  Substance Use Topics  . Alcohol use: No  . Drug use: No     Allergies   Patient has no known allergies.   Review of Systems Review of Systems  Ten systems reviewed and are negative for acute change, except as noted in the HPI.   Physical Exam Updated Vital Signs BP (!) 138/98   Pulse 87   Temp 98.2 F (36.8 C) (Oral)   Resp 16   Ht 6\' 1"  (1.854 m)   Wt 108.8 kg   SpO2 95%   BMI  31.65 kg/m   Physical Exam Vitals signs and nursing note reviewed.  Constitutional:      General: He is not in acute distress.    Appearance: He is well-developed. He is not diaphoretic.  HENT:     Head: Normocephalic and atraumatic.  Eyes:     General: No scleral icterus.    Conjunctiva/sclera: Conjunctivae normal.  Neck:     Musculoskeletal: Normal range of motion and neck supple.  Cardiovascular:     Rate and Rhythm: Normal rate and regular rhythm.     Heart sounds: Normal heart sounds.  Pulmonary:     Effort: Pulmonary effort is normal. No respiratory distress.     Breath sounds: Normal breath sounds.  Abdominal:     General: Abdomen is protuberant. There is distension.     Palpations: Abdomen is soft.     Tenderness: There is generalized abdominal tenderness. There is guarding.     Hernia: No hernia is present.  Skin:     General: Skin is warm and dry.  Neurological:     Mental Status: He is alert.  Psychiatric:        Behavior: Behavior normal.      ED Treatments / Results  Labs (all labs ordered are listed, but only abnormal results are displayed) Labs Reviewed  COMPREHENSIVE METABOLIC PANEL - Abnormal; Notable for the following components:      Result Value   Glucose, Bld 102 (*)    Creatinine, Ser 1.77 (*)    Calcium 8.6 (*)    GFR calc non Af Amer 41 (*)    GFR calc Af Amer 48 (*)    All other components within normal limits  LIPASE, BLOOD  CBC WITH DIFFERENTIAL/PLATELET  URINALYSIS, ROUTINE W REFLEX MICROSCOPIC    EKG None  Radiology No results found.  Procedures Procedures (including critical care time)  Medications Ordered in ED Medications  sodium chloride 0.9 % bolus 1,000 mL (1,000 mLs Intravenous New Bag/Given 11/06/18 1916)  HYDROmorphone (DILAUDID) injection 0.5 mg (0.5 mg Intravenous Given 11/06/18 1916)  ondansetron (ZOFRAN) injection 4 mg (4 mg Intravenous Given 11/06/18 1916)     Initial Impression / Assessment and Plan / ED Course  I have reviewed the triage vital signs and the nursing notes.  Pertinent labs & imaging results that were available during my care of the patient were reviewed by me and considered in my medical decision making (see chart for details).        WU:[id number] pain/ flank pain VS: BP (!) 150/87 (BP Location: Right Arm)   Pulse 87   Temp 98.2 F (36.8 C) (Oral)   Resp 20   Ht 6\' 1"  (1.854 m)   Wt 108.8 kg   SpO2 100%   BMI 31.65 kg/m  TU:UEKCMKL is gathered by patient  and EMR. DDX:The differential diagnosis of emergent flank pain includes, but is not limited to :Abdominal aortic aneurysm,, Renal artery embolism,Renal vein thrombosis, Aortic dissection, Mesenteric ischemia, Pyelonephritis, Renal infarction, Renal hemorrhage, Nephrolithiasis/ Renal Colic, Bladder tumor,Cystitis, Biliary colic, Pancreatitis Perforated peptic ulcer  Appendicitis ,Inguinal Hernia, Diverticulitis, Bowel obstruction Testicular torsion,Epididymitis Shingles Lower lobe pneumonia, Retroperitoneal hematoma/abscess/tumor, Epidural abscess, Epidural hematoma Labs: I reviewed the labs which show Worsening creatinine, no leukocytosis Imaging: I personally reviewed the images (CT abd/ pelv) which show(s) sitting perinephric stranding, movement of the ureteral list distally. EKG:n/a MDM: Patient with poor tolerance of outpatient treatment after lithotripsy.  He has obstructive uropathy although his stones have moved more distal he  has increased perinephric stranding and pain.  Patient has severe abdominal pain.  He is required multiple doses of IV narcotics for moderate pain control.  I discussed the case with Dr.Eschenroeder of urology at [facility] who has accepted the patient in transfer and plans on stent placement.  He is currently n.p.o.  Patient informed and agrees with plan for transfer. Patient disposition: Transfer Patient condition: Fair/stable. The patient appears reasonably stabilized for admission considering the current resources, flow, and capabilities available in the ED at this time, and I doubt any other [facility] requiring further screening and/or treatment in the ED prior to admission.   Final Clinical Impressions(s) / ED Diagnoses   Final diagnoses:  Ureterolithiasis    ED Discharge Orders    None       Margarita Mail, PA-C 11/06/18 2323    Davonna Belling, MD 11/06/18 2328

## 2018-11-06 NOTE — ED Notes (Signed)
Pt. Reports he has been having trouble with kidney stones and was here on Sunday with same.  Pt. Now here with abd. Pain.  Pt. Has noted bloating in abd.  Pt. Reports vomiting severely and feels pressure in abd.

## 2018-11-06 NOTE — ED Notes (Signed)
Spoke with Carla  @ [facility] who will contact Urology Service to call back Margarita Mail    [phone]

## 2020-06-18 IMAGING — CT CT RENAL STONE PROTOCOL
2 of 4 series · 16 of 46 positions shown, 18 images · non-contrast
Comparison: None.

CLINICAL DATA: Left flank and groin pain.  Lithotripsy on [REDACTED].

EXAM:
CT ABDOMEN AND PELVIS WITHOUT CONTRAST
TECHNIQUE: Multidetector CT imaging of the abdomen and pelvis was performed
following the standard protocol without IV contrast.

[Series 2: axial st · axial · 0.97mm/px · z∈[+738,+1233]mm · 13 of 109 slices shown, 15 images]
[im 5/109  soft-tissue]
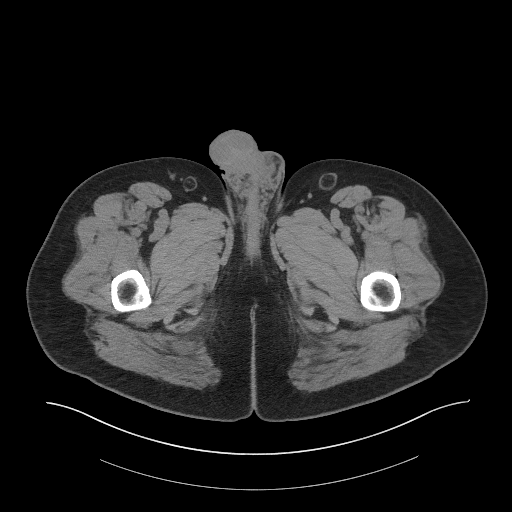
[im 5/109  bone]
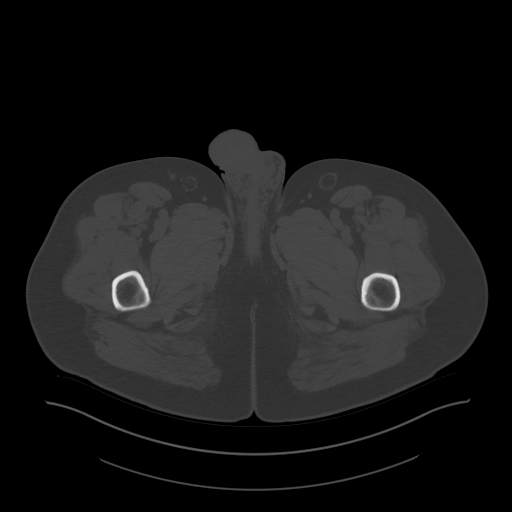
[im 14/109  soft-tissue]
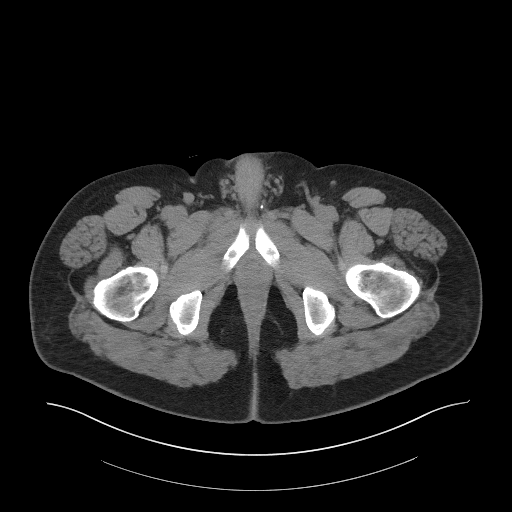
[im 23/109  soft-tissue]
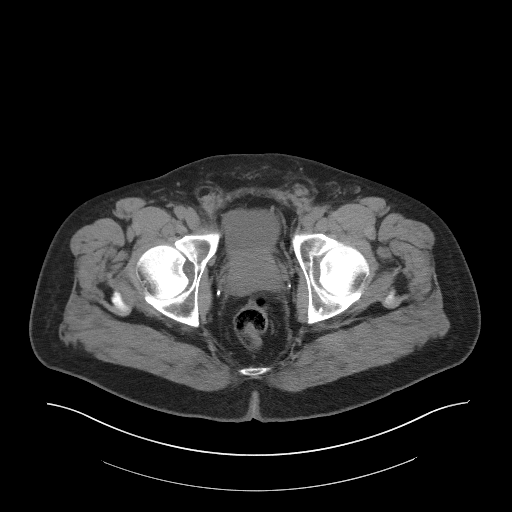
[im 32/109  soft-tissue]
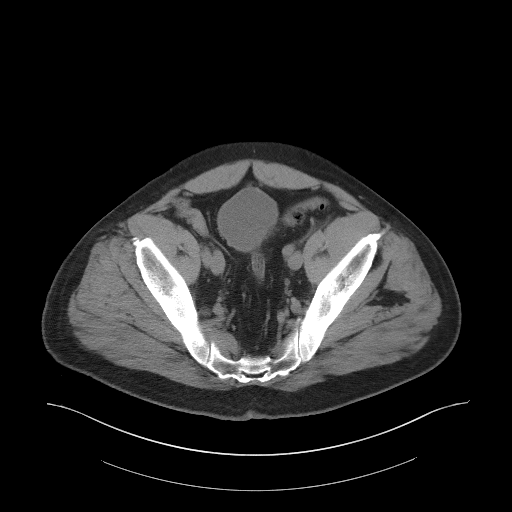
[im 37/109  soft-tissue]
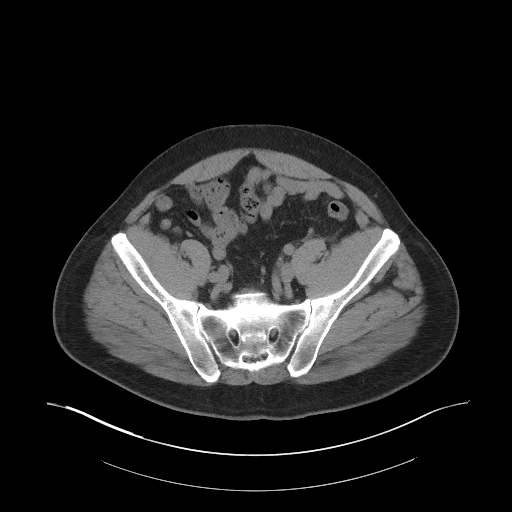
[im 46/109  soft-tissue]
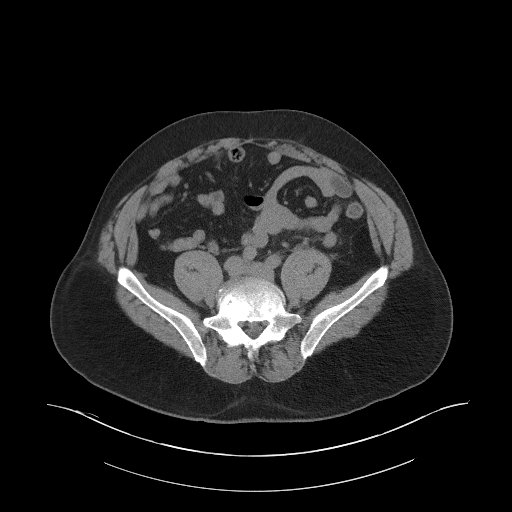
[im 55/109  soft-tissue]
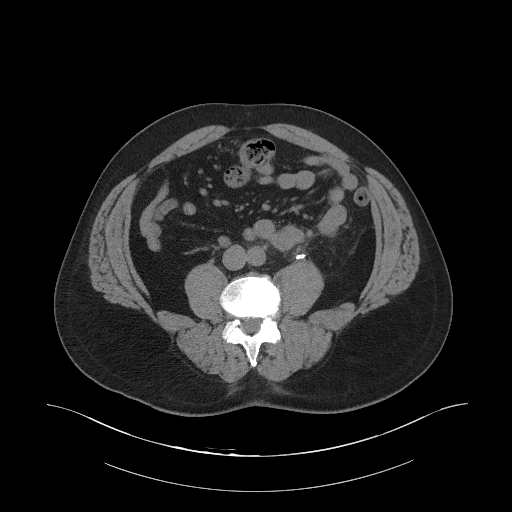
[im 64/109  soft-tissue]
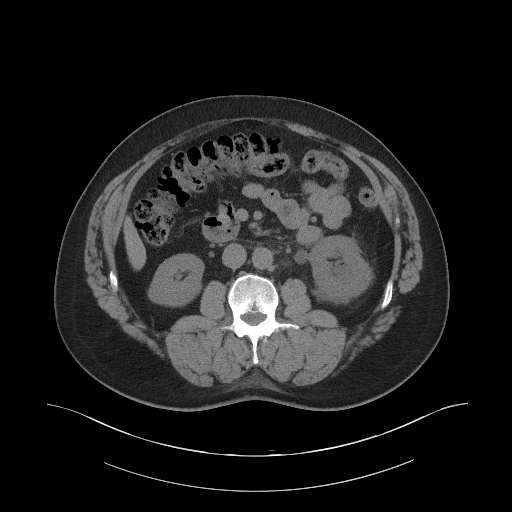
[im 73/109  soft-tissue]
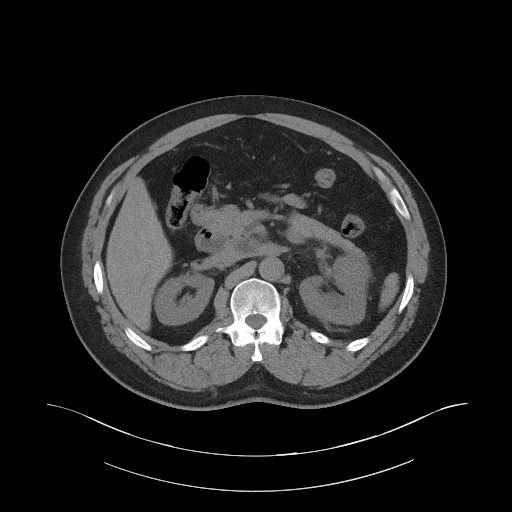
[im 73/109  bone]
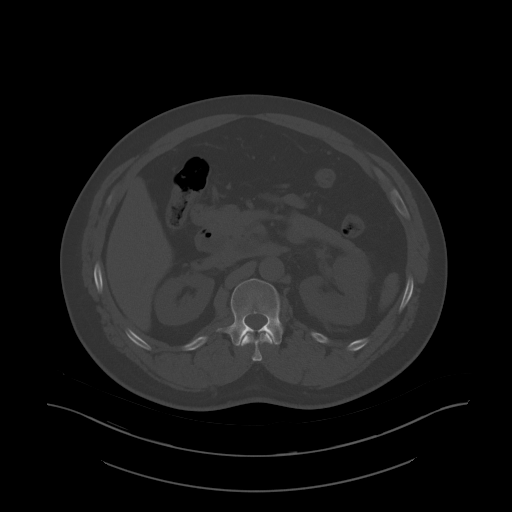
[im 77/109  soft-tissue]
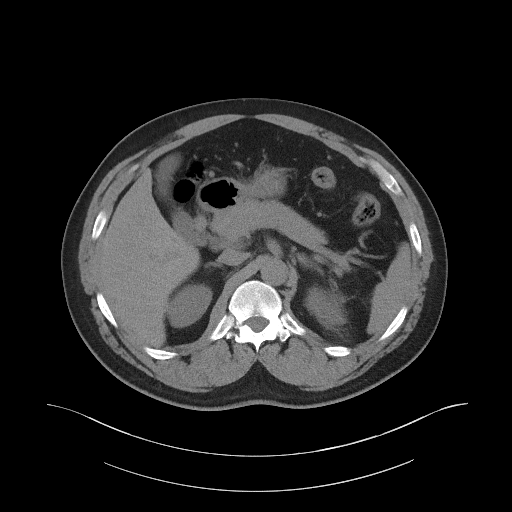
[im 86/109  soft-tissue]
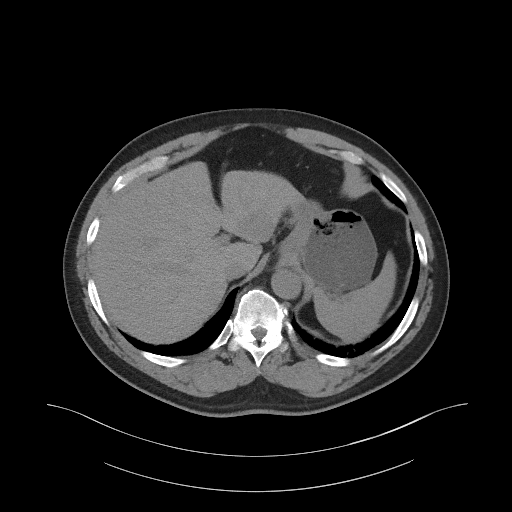
[im 95/109  soft-tissue]
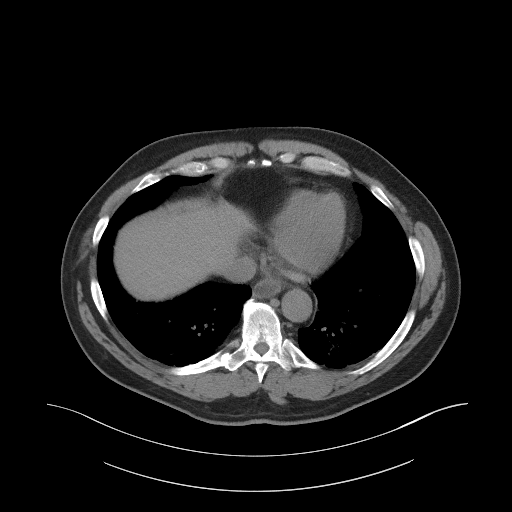
[im 104/109  soft-tissue]
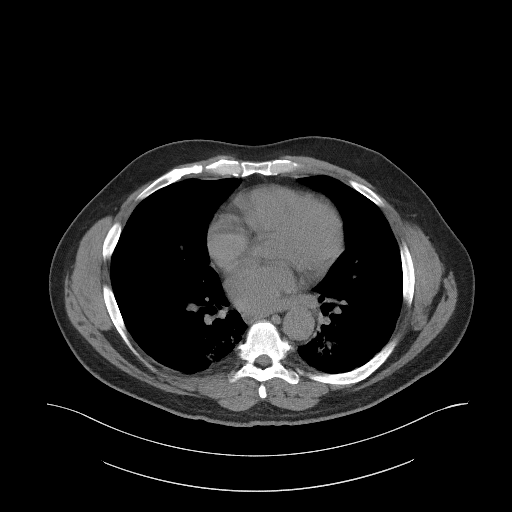

[Series 5: coronal st · coronal · 0.95mm/px · 3 of 102 slices shown]
[im 34/102  soft-tissue]
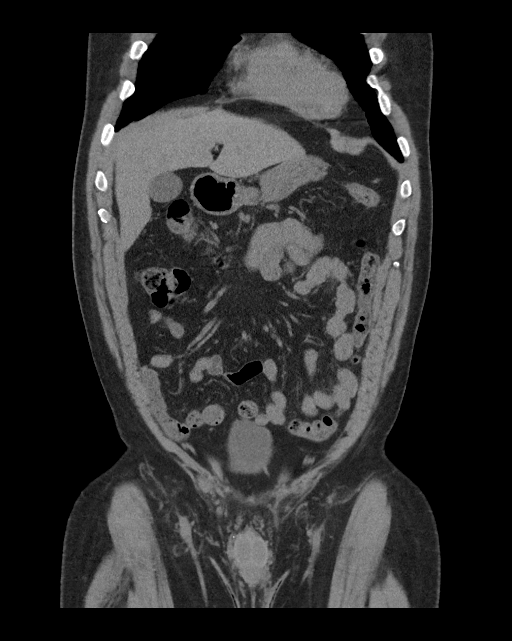
[im 45/102  soft-tissue]
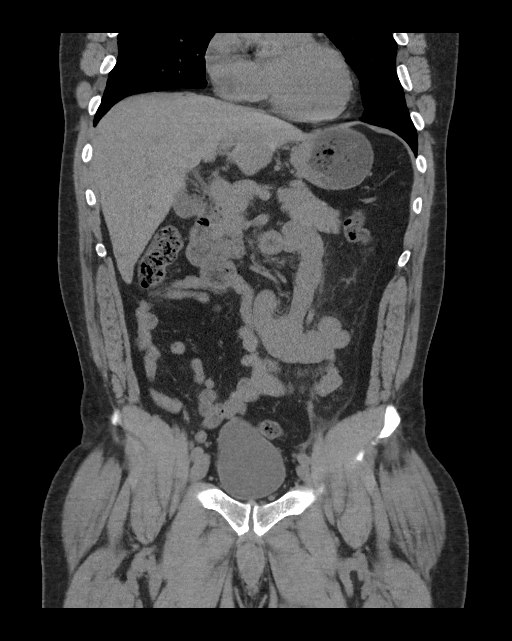
[im 57/102  soft-tissue]
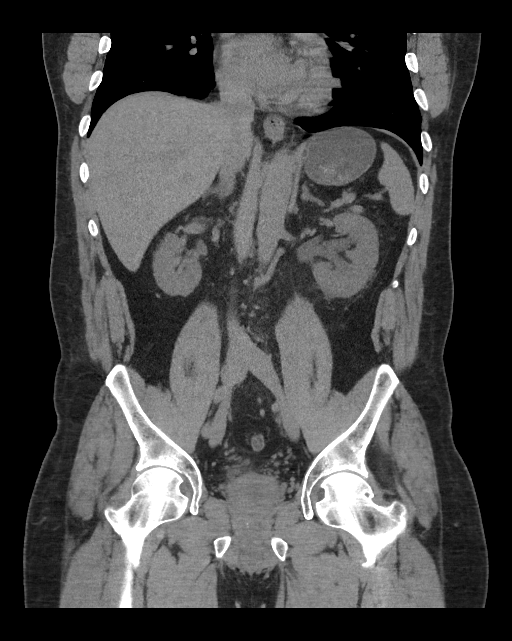

[16 of 46 positions shown; findings below may reference images not displayed]

FINDINGS: Lower chest: Bilateral lower lobe subsegmental atelectasis.

Hepatobiliary: 3.5 cm indeterminate lesion in segment 2. 1.0 cm
indeterminate lesion in segment 5 at the inferior tip of the liver.
The gallbladder is unremarkable. No biliary dilatation.

Pancreas: Unremarkable. No pancreatic ductal dilatation or
surrounding inflammatory changes.

Spleen: Normal in size without focal abnormality.

Adrenals/Urinary Tract: The adrenal glands and right kidney are
unremarkable. There are two calculi in the proximal left ureter
measuring up to 5 mm with resultant mild left hydronephrosis and
perinephric/periureteral stranding. There are multiple additional
small calculi in the distal left ureter approximately 2 cm from the
UVJ. The bladder is unremarkable.

Stomach/Bowel: Stomach is within normal limits. Appendix appears
normal. No evidence of bowel wall thickening, distention, or
inflammatory changes.

Vascular/Lymphatic: No significant vascular findings are present. No
enlarged abdominal or pelvic lymph nodes.

Reproductive: Mild prostatomegaly.

Other: No abdominal wall hernia or abnormality. No abdominopelvic
ascites. No pneumoperitoneum.

Musculoskeletal: No acute or significant osseous findings. Severe
degenerative disc disease at L3-L4 and L5-S1.
IMPRESSION: 1. Two calculi measuring up to 5 mm in the proximal left ureter with
resultant mild hydronephrosis. Additional cluster of small calculi
in the distal left ureter approximately 2 cm from the UVJ.
2. Two indeterminate lesions in the liver measuring up to 3.5 cm.
Comparison with prior imaging if available is recommended. If
stability of these lesions cannot be confirmed or these lesions have
not previously been characterized on prior outside imaging,
non-emergent MRI of the abdomen with and without contrast is
recommended.

## 2020-07-06 ENCOUNTER — Other Ambulatory Visit: Payer: Self-pay

## 2020-07-06 ENCOUNTER — Ambulatory Visit
Admission: EM | Admit: 2020-07-06 | Discharge: 2020-07-06 | Disposition: A | Discharge status: Home / Self Care | Payer: BLUE CROSS/BLUE SHIELD | Attending: Emergency Medicine | Admitting: Emergency Medicine

## 2020-07-06 DIAGNOSIS — J069 Acute upper respiratory infection, unspecified: Secondary | ICD-10-CM

## 2020-07-06 MED ORDER — BENZONATATE 200 MG PO CAPS: 200.0000 mg | ORAL_CAPSULE | Freq: Three times a day (TID) | ORAL | 0 refills | Status: AC | PRN

## 2020-07-06 MED ORDER — FLUTICASONE PROPIONATE 50 MCG/ACT NA SUSP: 1.0000 | Freq: Every day | NASAL | 0 refills | Status: AC

## 2020-07-06 NOTE — Discharge Instructions (Addendum)
Covid test pending Rest and fluids Ibuprofen and Tylenol for fevers body aches chills Tessalon for cough Flonase for congestion Follow-up if not improving or worsening

## 2020-07-06 NOTE — ED Provider Notes (Signed)
[name]    CSN: [id number] Arrival date & time: 07/06/20  1830      History   Chief Complaint Chief Complaint  Patient presents with  . Cough  . Sore Throat    HPI Ronald Hurley is a 60 y.o. male history of hypertension, presenting today for evaluation of a cough.  Has had cough with associated body aches.  Spouse here with similar symptoms.  Covid exposure around Christmas.  Reports having Covid 2 months ago and received Covid infusion.  HPI  Past Medical History:  Diagnosis Date  . Hypertension   . Kidney stone   . OSA (obstructive sleep apnea)     There are no problems to display for this patient.   Past Surgical History:  Procedure Laterality Date  . BACK SURGERY    . HERNIA REPAIR    . KNEE SURGERY    . KNEE SURGERY    . LITHOTRIPSY    . SHOULDER SURGERY    . SHOULDER SURGERY         Home Medications    Prior to Admission medications   Medication Sig Start Date End Date Taking? Authorizing Provider  benzonatate (TESSALON) 200 MG capsule Take 1 capsule (200 mg total) by mouth 3 (three) times daily as needed for up to 7 days for cough. 07/06/20 07/13/20 Yes Dyson Sevey C, PA-C  fluticasone (FLONASE) 50 MCG/ACT nasal spray Place 1-2 sprays into both nostrils daily. 07/06/20  Yes Korey Prashad C, PA-C  diclofenac (VOLTAREN) 75 MG EC tablet Take 75 mg by mouth 2 (two) times daily.    [provider]  hydrOXYzine (VISTARIL) 25 MG capsule Take 25 mg by mouth 3 (three) times daily as needed.    [provider]  PARoxetine (PAXIL) 20 MG tablet Take 20 mg by mouth daily.    [provider]    Family History History reviewed. No pertinent family history.  Social History Social History   Tobacco Use  . Smoking status: Never Smoker  . Smokeless tobacco: Never Used  Substance Use Topics  . Alcohol use: No  . Drug use: No     Allergies   Patient has no known allergies.   Review of Systems Review of Systems   Constitutional: Negative for activity change, appetite change, chills, fatigue and fever.  HENT: Negative for congestion, ear pain, rhinorrhea, sinus pressure, sore throat and trouble swallowing.   Eyes: Negative for discharge and redness.  Respiratory: Positive for cough. Negative for chest tightness and shortness of breath.   Cardiovascular: Negative for chest pain.  Gastrointestinal: Negative for abdominal pain, diarrhea, nausea and vomiting.  Musculoskeletal: Positive for myalgias.  Skin: Negative for rash.  Neurological: Negative for dizziness, light-headedness and headaches.     Physical Exam Triage Vital Signs ED Triage Vitals  Enc Vitals Group     BP      Pulse      Resp      Temp      Temp src      SpO2      Weight      Height      Head Circumference      Peak Flow      Pain Score      Pain Loc      Pain Edu?      Excl. in GC?    No data found.  Updated Vital Signs BP (!) 146/91 (BP Location: Left Arm)   Pulse 63  Temp 97.8 F (36.6 C) (Oral)   Resp 16   SpO2 98%   Visual Acuity Right Eye Distance:   Left Eye Distance:   Bilateral Distance:    Right Eye Near:   Left Eye Near:    Bilateral Near:     Physical Exam Vitals and nursing note reviewed.  Constitutional:      Appearance: He is well-developed and well-nourished.     Comments: No acute distress  HENT:     Head: Normocephalic and atraumatic.     Ears:     Comments: Bilateral ears without tenderness to palpation of external auricle, tragus and mastoid, EAC's without erythema or swelling, TM's with good bony landmarks and cone of light. Non erythematous.     Nose: Nose normal.     Mouth/Throat:     Comments: Oral mucosa pink and moist, no tonsillar enlargement or exudate. Posterior pharynx patent and nonerythematous, no uvula deviation or swelling. Normal phonation. Eyes:     Conjunctiva/sclera: Conjunctivae normal.  Cardiovascular:     Rate and Rhythm: Normal rate.  Pulmonary:      Effort: Pulmonary effort is normal. No respiratory distress.     Comments: Breathing comfortably at rest, CTABL, no wheezing, rales or other adventitious sounds auscultated Abdominal:     General: There is no distension.  Musculoskeletal:        General: Normal range of motion.     Cervical back: Neck supple.  Skin:    General: Skin is warm and dry.  Neurological:     Mental Status: He is alert and oriented to person, place, and time.  Psychiatric:        Mood and Affect: Mood and affect normal.      UC Treatments / Results  Labs (all labs ordered are listed, but only abnormal results are displayed) Labs Reviewed  NOVEL CORONAVIRUS, NAA    EKG   Radiology No results found.  Procedures Procedures (including critical care time)  Medications Ordered in UC Medications - No data to display  Initial Impression / Assessment and Plan / UC Course  I have reviewed the triage vital signs and the nursing notes.  Pertinent labs & imaging results that were available during my care of the patient were reviewed by me and considered in my medical decision making (see chart for details).     Viral URI with cough-Covid test pending, lower suspicion given recent Covid infection.  Recommending symptomatic and supportive care of symptoms, exam reassuring today, lungs clear.  Discussed strict return precautions. Patient verbalized understanding and is agreeable with plan.  Final Clinical Impressions(s) / UC Diagnoses   Final diagnoses:  Viral URI with cough     Discharge Instructions     Covid test pending Rest and fluids Ibuprofen and Tylenol for fevers body aches chills Tessalon for cough Flonase for congestion Follow-up if not improving or worsening    ED Prescriptions    Medication Sig Dispense Auth. Provider   benzonatate (TESSALON) 200 MG capsule Take 1 capsule (200 mg total) by mouth 3 (three) times daily as needed for up to 7 days for cough. 28 capsule Mordecai Tindol,  Devota Viruet C, PA-C   fluticasone (FLONASE) 50 MCG/ACT nasal spray Place 1-2 sprays into both nostrils daily. 16 g Karmello Abercrombie, [place] C, PA-C     PDMP not reviewed this encounter.   Lew Dawes, PA-C 07/08/20 1029

## 2020-07-06 NOTE — ED Triage Notes (Signed)
Pt c/o sore throat, cough, body aches, and cough since Friday. States had positive covid exposure on christmas.

## 2020-07-10 LAB — NOVEL CORONAVIRUS, NAA: SARS-CoV-2, NAA: NOT DETECTED

## 2020-08-18 ENCOUNTER — Emergency Department (HOSPITAL_BASED_OUTPATIENT_CLINIC_OR_DEPARTMENT_OTHER): Payer: Self-pay

## 2020-08-18 ENCOUNTER — Other Ambulatory Visit: Payer: Self-pay

## 2020-08-18 ENCOUNTER — Encounter (HOSPITAL_BASED_OUTPATIENT_CLINIC_OR_DEPARTMENT_OTHER): Payer: Self-pay

## 2020-08-18 ENCOUNTER — Emergency Department (HOSPITAL_BASED_OUTPATIENT_CLINIC_OR_DEPARTMENT_OTHER)
Admission: EM | Admit: 2020-08-18 | Discharge: 2020-08-18 | Disposition: A | Discharge status: Home / Self Care | Payer: Self-pay | Attending: Emergency Medicine | Admitting: Emergency Medicine

## 2020-08-18 DIAGNOSIS — R072 Precordial pain: Secondary | ICD-10-CM | POA: Insufficient documentation

## 2020-08-18 DIAGNOSIS — R079 Chest pain, unspecified: Secondary | ICD-10-CM

## 2020-08-18 DIAGNOSIS — R110 Nausea: Secondary | ICD-10-CM | POA: Insufficient documentation

## 2020-08-18 DIAGNOSIS — I10 Essential (primary) hypertension: Secondary | ICD-10-CM | POA: Insufficient documentation

## 2020-08-18 DIAGNOSIS — R0602 Shortness of breath: Secondary | ICD-10-CM | POA: Insufficient documentation

## 2020-08-18 DIAGNOSIS — R059 Cough, unspecified: Secondary | ICD-10-CM | POA: Insufficient documentation

## 2020-08-18 DIAGNOSIS — Z79899 Other long term (current) drug therapy: Secondary | ICD-10-CM | POA: Insufficient documentation

## 2020-08-18 LAB — BASIC METABOLIC PANEL
Anion gap: 8 (ref 5–15)
BUN: 21 mg/dL — ABNORMAL HIGH (ref 6–20)
CO2: 21 mmol/L — ABNORMAL LOW (ref 22–32)
Calcium: 8.5 mg/dL — ABNORMAL LOW (ref 8.9–10.3)
Chloride: 110 mmol/L (ref 98–111)
Creatinine, Ser: 0.92 mg/dL (ref 0.61–1.24)
GFR, Estimated: >60 mL/min (ref >60)
Glucose, Bld: 114 mg/dL — ABNORMAL HIGH (ref 70–99)
Potassium: 3.7 mmol/L (ref 3.5–5.1)
Sodium: 139 mmol/L (ref 135–145)

## 2020-08-18 LAB — CBC
HCT: 47.9 % (ref 39.0–52.0)
Hemoglobin: 16.3 g/dL (ref 13.0–17.0)
MCH: 30.2 pg (ref 26.0–34.0)
MCHC: 34 g/dL (ref 30.0–36.0)
MCV: 88.9 fL (ref 80.0–100.0)
Platelets: 180 10*3/uL (ref 150–400)
RBC: 5.39 MIL/uL (ref 4.22–5.81)
RDW: 12.6 % (ref 11.5–15.5)
WBC: 4.8 10*3/uL (ref 4.0–10.5)
nRBC: 0 % (ref 0.0–0.2)

## 2020-08-18 LAB — TROPONIN I (HIGH SENSITIVITY)
Troponin I (High Sensitivity): 3 ng/L (ref <18)
Troponin I (High Sensitivity): 3 ng/L (ref <18)

## 2020-08-18 MED ORDER — PANTOPRAZOLE SODIUM 20 MG PO TBEC: 20.0000 mg | DELAYED_RELEASE_TABLET | Freq: Every day | ORAL | 0 refills | Status: AC

## 2020-08-18 NOTE — ED Triage Notes (Signed)
Pt arrives with c/o CP reports pain started last night around 4 am, and then again today while driving. Pt reports that last night he used a inhaler with his CP and it stopped also reports his CP is central.

## 2020-08-18 NOTE — Discharge Instructions (Signed)
You were seen in the emergency department for evaluation of 2 episodes of some chest pain. Blood work EKG and a chest x-ray that did not show an obvious explanation for your symptoms.  There was no evidence of a heart attack.  Please schedule close follow-up with your primary care doctor.  Return to the emergency department if any worsening or concerning symptoms.

## 2020-08-18 NOTE — ED Notes (Signed)
ED Provider at bedside. 

## 2020-08-18 NOTE — ED Provider Notes (Signed)
[facility] Provider Note   CSN: [id number] Arrival date & time: 08/18/20  1048     History Chief Complaint  Patient presents with  . Chest Pain    Ronald Hurley is a 60 y.o. male.  He is here with a complaint of chest pain.  He said there was some central pressure that began last evening lasted a few hours with associated coughing.  Used an inhaler with some relief of the coughing.  Pain recurred again today while driving to work.  Lasted about 30 minutes today.  Currently pain-free.  Denies any prior history of same.  Was associated with some shortness of breath.  Little bit of nausea.  No diaphoresis.  Denies tobacco or cocaine.  No known coronary disease.  The history is provided by the patient.  Chest Pain Pain location:  Substernal area Pain quality: tightness   Pain radiates to:  Does not radiate Pain severity:  Moderate Onset quality:  Sudden Duration:  30 minutes Progression:  Resolved Chronicity:  New Relieved by:  Nothing Worsened by:  Nothing Ineffective treatments:  None tried Associated symptoms: cough, nausea and shortness of breath   Associated symptoms: no abdominal pain, no back pain, no diaphoresis, no dysphagia, no fever, no headache, no heartburn and no vomiting   Risk factors: hypertension and male sex   Risk factors: no prior DVT/PE and no smoking        Past Medical History:  Diagnosis Date  . Hypertension   . Kidney stone   . OSA (obstructive sleep apnea)     There are no problems to display for this patient.   Past Surgical History:  Procedure Laterality Date  . BACK SURGERY    . HERNIA REPAIR    . KNEE SURGERY    . KNEE SURGERY    . LITHOTRIPSY    . SHOULDER SURGERY    . SHOULDER SURGERY         No family history on file.  Social History   Tobacco Use  . Smoking status: Never Smoker  . Smokeless tobacco: Never Used  Substance Use Topics  . Alcohol use: No  . Drug use: No    Home  Medications Prior to Admission medications   Medication Sig Start Date End Date Taking? Authorizing Provider  amLODipine (NORVASC) 5 MG tablet Take 1 tablet by mouth daily. 07/12/18  Yes [provider]  naproxen sodium (ANAPROX) 550 MG tablet Take by mouth. 08/03/20 09/02/20 Yes [provider]  albuterol (VENTOLIN HFA) 108 (90 Base) MCG/ACT inhaler Inhale into the lungs. 04/07/20   [provider]  diclofenac (VOLTAREN) 75 MG EC tablet Take 75 mg by mouth 2 (two) times daily.    [provider]  fluticasone (FLONASE) 50 MCG/ACT nasal spray Place 1-2 sprays into both nostrils daily. 07/06/20   Wieters, Hallie C, PA-C  hydrOXYzine (VISTARIL) 25 MG capsule Take 25 mg by mouth 3 (three) times daily as needed.    [provider]  PARoxetine (PAXIL) 20 MG tablet Take 20 mg by mouth daily.    [provider]    Allergies    Patient has no known allergies.  Review of Systems   Review of Systems  Constitutional: Negative for diaphoresis and fever.  HENT: Negative for sore throat and trouble swallowing.   Eyes: Negative for visual disturbance.  Respiratory: Positive for cough and shortness of breath.   Cardiovascular: Positive for chest pain.  Gastrointestinal: Positive for nausea. Negative  for abdominal pain, heartburn and vomiting.  Genitourinary: Negative for dysuria.  Musculoskeletal: Negative for back pain.  Skin: Negative for rash.  Neurological: Negative for headaches.    Physical Exam Updated Vital Signs BP (!) 131/108 (BP Location: Left Arm)   Pulse 66   Temp 97.8 F (36.6 C) (Oral)   Resp 16   Ht 6\' 1"  (1.854 m)   Wt 111.1 kg   SpO2 97%   BMI 32.32 kg/m   Physical Exam Vitals and nursing note reviewed.  Constitutional:      Appearance: He is well-developed and well-nourished.  HENT:     Head: Normocephalic and atraumatic.  Eyes:     Conjunctiva/sclera: Conjunctivae normal.  Cardiovascular:     Rate and Rhythm: Normal  rate and regular rhythm.     Heart sounds: Normal heart sounds. No murmur heard.   Pulmonary:     Effort: Pulmonary effort is normal. No respiratory distress.     Breath sounds: Normal breath sounds.  Abdominal:     Palpations: Abdomen is soft.     Tenderness: There is no abdominal tenderness.  Musculoskeletal:        General: No edema.     Cervical back: Neck supple.     Right lower leg: No tenderness. No edema.     Left lower leg: No tenderness. No edema.  Skin:    General: Skin is warm and dry.     Capillary Refill: Capillary refill takes less than 2 seconds.  Neurological:     General: No focal deficit present.     Mental Status: He is alert.  Psychiatric:        Mood and Affect: Mood and affect normal.     ED Results / Procedures / Treatments   Labs (all labs ordered are listed, but only abnormal results are displayed) Labs Reviewed  BASIC METABOLIC PANEL - Abnormal; Notable for the following components:      Result Value   CO2 21 (*)    Glucose, Bld 114 (*)    BUN 21 (*)    Calcium 8.5 (*)    All other components within normal limits  CBC  TROPONIN I (HIGH SENSITIVITY)  TROPONIN I (HIGH SENSITIVITY)    EKG EKG Interpretation  Date/Time:  Tuesday August 18 2020 10:45:57 EST Ventricular Rate:  63 PR Interval:  196 QRS Duration: 88 QT Interval:  378 QTC Calculation: 386 R Axis:   25 Text Interpretation: Normal sinus rhythm Nonspecific T wave abnormality Abnormal ECG No significant change since prior 11/17 Confirmed by Aletta Edouard [phone]) on 08/18/2020 11:53:35 AM   Radiology DG Chest 2 View  Result Date: 08/18/2020 CLINICAL DATA:  Chest pain. EXAM: CHEST - 2 VIEW COMPARISON:  None. FINDINGS: The heart size and mediastinal contours are within normal limits. Both lungs are clear. No visible pleural effusions or pneumothorax. No acute osseous abnormality. IMPRESSION: No active cardiopulmonary disease. Electronically Signed   By: Margaretha Sheffield MD    On: 08/18/2020 11:16    Procedures Procedures   Medications Ordered in ED Medications - No data to display  ED Course  I have reviewed the triage vital signs and the nursing notes.  Pertinent labs & imaging results that were available during my care of the patient were reviewed by me and considered in my medical decision making (see chart for details).  Clinical Course as of 08/18/20 1653  Tue Aug 18, 2020  1113 Chest x-ray interpreted by me as no acute infiltrates.  Awaiting radiology reading. [MB]  1221 EKG is not crossing into epic.  My interpretation is normal sinus rhythm rate of 63 nonspecific T wave inversions anteriorly and inferiorly similar to prior EKG.  Normal intervals. [MB]  0981 Reviewed results with patient and he is comfortable plan for trial of Protonix and outpatient follow-up with his PCP.  Return instructions discussed [MB]    Clinical Course User Index [MB] Hayden Rasmussen, MD   MDM Rules/Calculators/A&P                         This patient complains of 2 episodes of chest pain; this involves an extensive number of treatment Options and is a complaint that carries with it a high risk of complications and Morbidity. The differential includes ACS, pneumonia, PE, pneumothorax, musculoskeletal, GERD  I ordered, reviewed and interpreted labs, which included CBC with normal white count normal hemoglobin, chemistries with mildly low bicarb, troponins flat I ordered imaging studies which included chest x-ray and I independently    visualized and interpreted imaging which showed no acute disease Previous records obtained and reviewed in epic, no recent admissions  After the interventions stated above, I reevaluated the patient and found patient to remain pain-free and hemodynamically stable.  Recommended close follow-up with PCP.  We will trial him on a PPI.  Return instructions discussed  Final Clinical Impression(s) / ED Diagnoses Final diagnoses:  Nonspecific  chest pain    Rx / DC Orders ED Discharge Orders         Ordered    pantoprazole (PROTONIX) 20 MG tablet  Daily        08/18/20 1355           Hayden Rasmussen, MD 08/18/20 1655

## 2022-06-17 ENCOUNTER — Emergency Department (HOSPITAL_BASED_OUTPATIENT_CLINIC_OR_DEPARTMENT_OTHER)
Admission: EM | Admit: 2022-06-17 | Discharge: 2022-06-17 | Disposition: A | Discharge status: Home / Self Care | Payer: Medicare Other | Attending: Emergency Medicine | Admitting: Emergency Medicine

## 2022-06-17 ENCOUNTER — Encounter (HOSPITAL_BASED_OUTPATIENT_CLINIC_OR_DEPARTMENT_OTHER): Payer: Self-pay

## 2022-06-17 ENCOUNTER — Emergency Department (HOSPITAL_BASED_OUTPATIENT_CLINIC_OR_DEPARTMENT_OTHER): Payer: Medicare Other

## 2022-06-17 ENCOUNTER — Other Ambulatory Visit: Payer: Self-pay

## 2022-06-17 DIAGNOSIS — M25511 Pain in right shoulder: Secondary | ICD-10-CM | POA: Diagnosis not present

## 2022-06-17 DIAGNOSIS — I10 Essential (primary) hypertension: Secondary | ICD-10-CM | POA: Diagnosis not present

## 2022-06-17 DIAGNOSIS — Z7952 Long term (current) use of systemic steroids: Secondary | ICD-10-CM | POA: Insufficient documentation

## 2022-06-17 MED ORDER — KETOROLAC TROMETHAMINE 15 MG/ML IJ SOLN
15.0000 mg | Freq: Once | INTRAMUSCULAR | Status: AC
  Administered 2022-06-17: 15 mg via INTRAMUSCULAR
  Filled 2022-06-17: qty 1

## 2022-06-17 MED ORDER — DEXAMETHASONE SODIUM PHOSPHATE 10 MG/ML IJ SOLN
10.0000 mg | Freq: Once | INTRAMUSCULAR | Status: AC
  Administered 2022-06-17: 10 mg via INTRAMUSCULAR
  Filled 2022-06-17: qty 1

## 2022-06-17 NOTE — ED Notes (Signed)
Patient transported to X-ray 

## 2022-06-17 NOTE — ED Triage Notes (Signed)
Complaining of rt shoulder tightness that started yesterday and has gotten worse today. Denies any injury, but has had the pain before.  Michela Pitcher it was his rotator cuff the last time

## 2022-06-17 NOTE — ED Notes (Signed)
Meds drawn for pt, not available at present.

## 2022-06-17 NOTE — Discharge Instructions (Signed)
Tylenol and ibuprofen as needed for pain.  Follow-up with your regular doctor for reevaluation in the next week. Return to the ED for chest pain, shortness of breath, numbness or new or concerning symptoms.

## 2022-06-17 NOTE — ED Provider Notes (Addendum)
[facility] Provider Note   CSN: [id number] Arrival date & time: 06/17/22  1828     History  Chief Complaint  Patient presents with   Shoulder Pain    Ronald Hurley is a 61 y.o. male.   Shoulder Pain    Patient with medical history of hypertension, OSA, prior right shoulder rotator cuff repair status post reconstructive surgery 4 years ago presents today due to right shoulder pain.  Started about a day ago atraumatically.  Is worse with any movement especially with raising the arm above his head.  It feels like previous rotator cuff injuries.  Denies any paresthesias, neck pain, chest pain, shortness of breath.  No recent injuries or falls.  States previously he is felt better after starting steroids and anti-inflammatories.  Home Medications Prior to Admission medications   Medication Sig Start Date End Date Taking? Authorizing Provider  albuterol (VENTOLIN HFA) 108 (90 Base) MCG/ACT inhaler Inhale into the lungs. 04/07/20   [provider]  amLODipine (NORVASC) 5 MG tablet Take 1 tablet by mouth daily. 07/12/18   [provider]  diclofenac (VOLTAREN) 75 MG EC tablet Take 75 mg by mouth 2 (two) times daily.    [provider]  fluticasone (FLONASE) 50 MCG/ACT nasal spray Place 1-2 sprays into both nostrils daily. 07/06/20   Wieters, Hallie C, PA-C  hydrOXYzine (VISTARIL) 25 MG capsule Take 25 mg by mouth 3 (three) times daily as needed.    [provider]  pantoprazole (PROTONIX) 20 MG tablet Take 1 tablet (20 mg total) by mouth daily. 08/18/20   Hayden Rasmussen, MD  PARoxetine (PAXIL) 20 MG tablet Take 20 mg by mouth daily.    [provider]      Allergies    Patient has no known allergies.    Review of Systems   Review of Systems  Physical Exam Updated Vital Signs BP (!) 146/93 (BP Location: Right Arm)   Pulse 67   Temp 97.9 F (36.6 C) (Oral)   Resp 18   Ht '6\' 1"'$  (1.854 m)   Wt 108.9 kg    SpO2 98%   BMI 31.66 kg/m  Physical Exam Vitals and nursing note reviewed. Exam conducted with a chaperone present.  Constitutional:      General: He is not in acute distress.    Appearance: Normal appearance.  HENT:     Head: Normocephalic and atraumatic.  Eyes:     General: No scleral icterus.       Right eye: No discharge.        Left eye: No discharge.     Extraocular Movements: Extraocular movements intact.     Pupils: Pupils are equal, round, and reactive to light.  Neck:     Comments: No midline tenderness, complete ROM to cervical spine Cardiovascular:     Rate and Rhythm: Normal rate and regular rhythm.     Pulses: Normal pulses.     Heart sounds: Normal heart sounds. No murmur heard.    No friction rub. No gallop.  Pulmonary:     Effort: Pulmonary effort is normal. No respiratory distress.     Breath sounds: Normal breath sounds.  Abdominal:     General: Abdomen is flat. Bowel sounds are normal. There is no distension.     Palpations: Abdomen is soft.     Tenderness: There is no abdominal tenderness.  Musculoskeletal:        General: Tenderness present.  Skin:  General: Skin is warm and dry.     Capillary Refill: Capillary refill takes less than 2 seconds.     Coloration: Skin is not jaundiced.  Neurological:     Mental Status: He is alert. Mental status is at baseline.     Coordination: Coordination normal.     ED Results / Procedures / Treatments   Labs (all labs ordered are listed, but only abnormal results are displayed) Labs Reviewed - No data to display  EKG None  Radiology DG Shoulder Right  Result Date: 06/17/2022 CLINICAL DATA:  Right shoulder pain EXAM: RIGHT SHOULDER - 2+ VIEW COMPARISON:  None Available. FINDINGS: There is no evidence of fracture or dislocation. Mild glenohumeral and acromioclavicular osteoarthritis. No appreciable joint effusion. IMPRESSION: Mild glenohumeral and acromioclavicular osteoarthritis. Electronically Signed    By: Keane Police D.O.   On: 06/17/2022 20:31    Procedures Procedures    Medications Ordered in ED Medications  dexamethasone (DECADRON) injection 10 mg (10 mg Intramuscular Given 06/17/22 2012)  ketorolac (TORADOL) 15 MG/ML injection 15 mg (15 mg Intramuscular Given 06/17/22 2012)    ED Course/ Medical Decision Making/ A&P                           Medical Decision Making Amount and/or Complexity of Data Reviewed Radiology: ordered.  Risk Prescription drug management.   Patient presents due to right shoulder pain.  It is very reproducible with range of the shoulder suspicious for muscular injury.  He is neurovascular intact with brisk cap refill radial pulse 2+.  I did not appreciate any crepitus, no obvious deformity.  Considered ACS but few risk factors, also no chest pain or shortness of breath, and based on exam is clearly reproducible and musculoskeletal.   Plain films ordered due to history of previous surgical repair, no acute abnormalities noted.  Agree with radiologist.    I ordered Decadron and Toradol for patient's pain with improvement.  Sling provided for supportive care.  He will follow-up with his orthopedic surgeon for reevaluation. .        Final Clinical Impression(s) / ED Diagnoses Final diagnoses:  Acute pain of right shoulder    Rx / DC Orders ED Discharge Orders     None         Sherrill Raring, PA-C 06/17/22 2305    Sherrill Raring, PA-C 06/17/22 2306    Blanchie Dessert, MD 06/17/22 2332

## 2023-12-16 ENCOUNTER — Emergency Department (HOSPITAL_BASED_OUTPATIENT_CLINIC_OR_DEPARTMENT_OTHER)

## 2023-12-16 ENCOUNTER — Encounter (HOSPITAL_BASED_OUTPATIENT_CLINIC_OR_DEPARTMENT_OTHER): Payer: Self-pay

## 2023-12-16 ENCOUNTER — Emergency Department (HOSPITAL_BASED_OUTPATIENT_CLINIC_OR_DEPARTMENT_OTHER)
Admission: EM | Admit: 2023-12-16 | Discharge: 2023-12-16 | Disposition: A | Discharge status: Home / Self Care | Attending: Emergency Medicine | Admitting: Emergency Medicine

## 2023-12-16 DIAGNOSIS — L237 Allergic contact dermatitis due to plants, except food: Secondary | ICD-10-CM | POA: Diagnosis not present

## 2023-12-16 DIAGNOSIS — R0789 Other chest pain: Secondary | ICD-10-CM | POA: Diagnosis not present

## 2023-12-16 DIAGNOSIS — R079 Chest pain, unspecified: Secondary | ICD-10-CM

## 2023-12-16 DIAGNOSIS — M25511 Pain in right shoulder: Secondary | ICD-10-CM | POA: Diagnosis not present

## 2023-12-16 DIAGNOSIS — R21 Rash and other nonspecific skin eruption: Secondary | ICD-10-CM | POA: Diagnosis present

## 2023-12-16 LAB — BASIC METABOLIC PANEL WITH GFR
Anion gap: 12 (ref 5–15)
BUN: 17 mg/dL (ref 8–23)
CO2: 21 mmol/L — ABNORMAL LOW (ref 22–32)
Calcium: 8.6 mg/dL — ABNORMAL LOW (ref 8.9–10.3)
Chloride: 109 mmol/L (ref 98–111)
Creatinine, Ser: 1.22 mg/dL (ref 0.61–1.24)
GFR, Estimated: >60 mL/min (ref >60)
Glucose, Bld: 148 mg/dL — ABNORMAL HIGH (ref 70–99)
Potassium: 3.7 mmol/L (ref 3.5–5.1)
Sodium: 142 mmol/L (ref 135–145)

## 2023-12-16 LAB — CBC
HCT: 44 % (ref 39.0–52.0)
Hemoglobin: 14.8 g/dL (ref 13.0–17.0)
MCH: 29.6 pg (ref 26.0–34.0)
MCHC: 33.6 g/dL (ref 30.0–36.0)
MCV: 88 fL (ref 80.0–100.0)
Platelets: 175 10*3/uL (ref 150–400)
RBC: 5 MIL/uL (ref 4.22–5.81)
RDW: 12.7 % (ref 11.5–15.5)
WBC: 5.8 10*3/uL (ref 4.0–10.5)
nRBC: 0 % (ref 0.0–0.2)

## 2023-12-16 LAB — TROPONIN T, HIGH SENSITIVITY: Troponin T High Sensitivity: <15 ng/L (ref <19)

## 2023-12-16 MED ORDER — METHYLPREDNISOLONE 4 MG PO TBPK: ORAL_TABLET | ORAL | 0 refills | Status: AC

## 2023-12-16 NOTE — ED Provider Notes (Signed)
 [place] [facility] AT [facility] Provider Note   CSN: [id number] Arrival date & time: 12/16/23  7829     Patient presents with: Rash   Ronald Hurley is a 63 y.o. male.    Rash Patient presents with rash shoulder pain and chest pain.  Has had shoulder pain for a while now.  Has been seen by pain management has plans for an injection into the shoulder and also an MRI scheduled for tomorrow.  Get seen by pain management for this.  Also has rash to right forearm.  Had been working in the yard Wednesday.  Noticed rash.  It is itchy.  On both anterior and posterior forearm.  No fevers.  Also had chest pain.  Anterior chest.  Dull yesterday.  Has had previous pains and felt food getting stuck in his chest.  Had vomited with that.  Has plans to be scoped by GI.  States this pain was different.  Not exertional.      Prior to Admission medications   Medication Sig Start Date End Date Taking? Authorizing Provider  methylPREDNISolone (MEDROL DOSEPAK) 4 MG TBPK tablet Use per package directions. 12/16/23  Yes Mozell Arias, MD  albuterol (VENTOLIN HFA) 108 (90 Base) MCG/ACT inhaler Inhale into the lungs. 04/07/20   [provider]  amLODipine (NORVASC) 5 MG tablet Take 1 tablet by mouth daily. 07/12/18   [provider]  diclofenac (VOLTAREN) 75 MG EC tablet Take 75 mg by mouth 2 (two) times daily.    [provider]  fluticasone  (FLONASE ) 50 MCG/ACT nasal spray Place 1-2 sprays into both nostrils daily. 07/06/20   Wieters, Hallie C, PA-C  hydrOXYzine (VISTARIL) 25 MG capsule Take 25 mg by mouth 3 (three) times daily as needed.    [provider]  pantoprazole  (PROTONIX ) 20 MG tablet Take 1 tablet (20 mg total) by mouth daily. 08/18/20   Butler, Michael C, MD  PARoxetine (PAXIL) 20 MG tablet Take 20 mg by mouth daily.    [provider]    Allergies: Patient has no known allergies.    Review of Systems  Skin:  Positive  for rash.    Updated Vital Signs BP 125/82 (BP Location: Left Arm)   Pulse 71   Temp 98.6 F (37 C) (Oral)   Resp 15   Ht 6' 1 (1.854 m)   Wt 108.4 kg   SpO2 97%   BMI 31.53 kg/m   Physical Exam Vitals and nursing note reviewed.   Cardiovascular:     Rate and Rhythm: Normal rate.  Chest:     Chest wall: No tenderness.  Abdominal:     Tenderness: There is no abdominal tenderness.   Skin:    Comments: 2 erythematous somewhat indurated plaques on forearm.  1 anterior 1 posterior.  Approximately 6 to 7 cm long on each.  No drainage.  No fluctuance.   Neurological:     Mental Status: He is alert.     (all labs ordered are listed, but only abnormal results are displayed) Labs Reviewed  BASIC METABOLIC PANEL WITH GFR - Abnormal; Notable for the following components:      Result Value   CO2 21 (*)    Glucose, Bld 148 (*)    Calcium 8.6 (*)    All other components within normal limits  CBC  TROPONIN T, HIGH SENSITIVITY    EKG: EKG Interpretation Date/Time:  Saturday December 16 2023 07:22:32 EDT Ventricular Rate:  63 PR Interval:  205 QRS Duration:  107 QT Interval:  419 QTC Calculation: 429 R Axis:   39  Text Interpretation: Sinus rhythm Left atrial enlargement Nonspecific T abnormalities, diffuse leads No significant change since last tracing Confirmed by Mozell Arias [id number]) on 12/16/2023 7:25:26 AM  Radiology: [facility] Query Chest 2 View Result Date: 12/16/2023 CLINICAL DATA:  Chest pain EXAM: CHEST - 2 VIEW COMPARISON:  02/11/2022 FINDINGS: The lungs are clear without focal pneumonia, edema, pneumothorax or pleural effusion. The cardiopericardial silhouette is within normal limits for size. No acute bony abnormality. Telemetry leads overlie the chest. IMPRESSION: No active cardiopulmonary disease. Electronically Signed   By: Donnal Fusi M.D.   On: 12/16/2023 08:30     Procedures   Medications Ordered in the ED - No data to display                                   Medical Decision Making Amount and/or Complexity of Data Reviewed Labs: ordered. Radiology: ordered.  Risk Prescription drug management.   Patient has few different complaints.  Right shoulder pain.  Now chronic and seeing pain medicine.  Plans for injection and MRI.  Also rash.  Had been working in the yard.  Somewhat bumpy and likely consistent with poison ivy or similar.  Is pruritic.  Does not appear to be infectious.  Also chest pain.  Dull.  EKG stable to prior.  Differential diagnosis does include nonspecific chest pain but also cardiac cause or GI cause.  No abdominal tenderness.  Will get x-ray basic blood work.  Blood work reassuring.  Doubt cardiac cause.  Will have patient follow-up with PCP and will likely need GI which she already had plans for.  Likely poison ivy or equivalent.  Will treat with steroids.  May also help with his shoulder pain.     Final diagnoses:  Poison ivy dermatitis  Nonspecific chest pain    ED Discharge Orders          Ordered    methylPREDNISolone (MEDROL DOSEPAK) 4 MG TBPK tablet        12/16/23 0849               Mozell Arias, MD 12/16/23 1429

## 2023-12-16 NOTE — ED Triage Notes (Addendum)
 Seen by PCP last week for posterior neck pain radiating down right arm, MRI scheduled tomorrow.  Was in yard Wednesday pulling weeds, that night noticed rash to right arm. Areas are hard and painful.   Adds he had chest pain when he woke up yesterday.

## 2023-12-16 NOTE — ED Notes (Signed)
 ED Provider at bedside.

## 2023-12-16 NOTE — ED Notes (Signed)
 Patient transported to X-ray
